# Patient Record
Sex: Female | Born: 1960 | Race: White | Hispanic: No | Marital: Single | Smoking: Never smoker
Health system: Southern US, Community
[De-identification: ages and names within clinical notes are randomized; demographics above are authoritative.]

## PROBLEM LIST (undated history)

## (undated) DIAGNOSIS — K449 Diaphragmatic hernia without obstruction or gangrene: Secondary | ICD-10-CM

## (undated) DIAGNOSIS — Z87442 Personal history of urinary calculi: Secondary | ICD-10-CM

## (undated) DIAGNOSIS — K8020 Calculus of gallbladder without cholecystitis without obstruction: Secondary | ICD-10-CM

## (undated) DIAGNOSIS — M797 Fibromyalgia: Secondary | ICD-10-CM

## (undated) DIAGNOSIS — Z8679 Personal history of other diseases of the circulatory system: Secondary | ICD-10-CM

## (undated) DIAGNOSIS — G40909 Epilepsy, unspecified, not intractable, without status epilepticus: Secondary | ICD-10-CM

## (undated) DIAGNOSIS — G43909 Migraine, unspecified, not intractable, without status migrainosus: Secondary | ICD-10-CM

## (undated) DIAGNOSIS — S99191A Other physeal fracture of right metatarsal, initial encounter for closed fracture: Secondary | ICD-10-CM

## (undated) DIAGNOSIS — I10 Essential (primary) hypertension: Secondary | ICD-10-CM

## (undated) DIAGNOSIS — M542 Cervicalgia: Secondary | ICD-10-CM

## (undated) DIAGNOSIS — J45909 Unspecified asthma, uncomplicated: Secondary | ICD-10-CM

## (undated) DIAGNOSIS — M67439 Ganglion, unspecified wrist: Secondary | ICD-10-CM

## (undated) DIAGNOSIS — N390 Urinary tract infection, site not specified: Secondary | ICD-10-CM

## (undated) DIAGNOSIS — K219 Gastro-esophageal reflux disease without esophagitis: Secondary | ICD-10-CM

## (undated) DIAGNOSIS — E7800 Pure hypercholesterolemia, unspecified: Secondary | ICD-10-CM

## (undated) DIAGNOSIS — M1990 Unspecified osteoarthritis, unspecified site: Secondary | ICD-10-CM

## (undated) DIAGNOSIS — R928 Other abnormal and inconclusive findings on diagnostic imaging of breast: Secondary | ICD-10-CM

## (undated) DIAGNOSIS — G8929 Other chronic pain: Secondary | ICD-10-CM

## (undated) DIAGNOSIS — Z550 Illiteracy and low-level literacy: Secondary | ICD-10-CM

## (undated) DIAGNOSIS — F419 Anxiety disorder, unspecified: Secondary | ICD-10-CM

## (undated) DIAGNOSIS — F32A Depression, unspecified: Secondary | ICD-10-CM

## (undated) DIAGNOSIS — N200 Calculus of kidney: Secondary | ICD-10-CM

## (undated) DIAGNOSIS — F329 Major depressive disorder, single episode, unspecified: Secondary | ICD-10-CM

## (undated) DIAGNOSIS — E119 Type 2 diabetes mellitus without complications: Secondary | ICD-10-CM

## (undated) DIAGNOSIS — I208 Other forms of angina pectoris: Secondary | ICD-10-CM

## (undated) DIAGNOSIS — K760 Fatty (change of) liver, not elsewhere classified: Secondary | ICD-10-CM

## (undated) HISTORY — DX: Other chronic pain: G89.29

## (undated) HISTORY — DX: Fatty (change of) liver, not elsewhere classified: K76.0

## (undated) HISTORY — DX: Major depressive disorder, single episode, unspecified: F32.9

## (undated) HISTORY — DX: Unspecified osteoarthritis, unspecified site: M19.90

## (undated) HISTORY — PX: URETHRA SURGERY: SHX824

## (undated) HISTORY — DX: Pure hypercholesterolemia, unspecified: E78.00

## (undated) HISTORY — DX: Gastro-esophageal reflux disease without esophagitis: K21.9

## (undated) HISTORY — DX: Urinary tract infection, site not specified: N39.0

## (undated) HISTORY — DX: Other forms of angina pectoris: I20.8

## (undated) HISTORY — DX: Depression, unspecified: F32.A

## (undated) HISTORY — DX: Anxiety disorder, unspecified: F41.9

## (undated) HISTORY — DX: Diaphragmatic hernia without obstruction or gangrene: K44.9

## (undated) HISTORY — PX: APPENDECTOMY: SHX54

## (undated) HISTORY — DX: Migraine, unspecified, not intractable, without status migrainosus: G43.909

## (undated) HISTORY — PX: HERNIA REPAIR: SHX51

## (undated) HISTORY — DX: Calculus of kidney: N20.0

## (undated) HISTORY — PX: CHOLECYSTECTOMY: SHX55

## (undated) HISTORY — DX: Cervicalgia: M54.2

## (undated) HISTORY — DX: Fibromyalgia: M79.7

## (undated) HISTORY — DX: Type 2 diabetes mellitus without complications: E11.9

## (undated) HISTORY — DX: Calculus of gallbladder without cholecystitis without obstruction: K80.20

## (undated) HISTORY — DX: Essential (primary) hypertension: I10

## (undated) HISTORY — DX: Epilepsy, unspecified, not intractable, without status epilepticus: G40.909

## (undated) HISTORY — PX: OTHER SURGICAL HISTORY: SHX169

## (undated) HISTORY — DX: Unspecified asthma, uncomplicated: J45.909

---
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
## 1990-02-18 DIAGNOSIS — Z87898 Personal history of other specified conditions: Secondary | ICD-10-CM

## 1990-02-18 HISTORY — DX: Personal history of other specified conditions: Z87.898

## 2012-12-17 ENCOUNTER — Ambulatory Visit: Payer: Self-pay

## 2013-02-18 HISTORY — PX: CHOLECYSTECTOMY: SHX55

## 2013-02-18 HISTORY — PX: COLPOSCOPY: SHX161

## 2014-06-01 HISTORY — PX: COLONOSCOPY: SHX174

## 2015-10-25 HISTORY — PX: ESOPHAGOGASTRODUODENOSCOPY: SHX1529

## 2017-01-01 DIAGNOSIS — M549 Dorsalgia, unspecified: Secondary | ICD-10-CM | POA: Insufficient documentation

## 2017-01-01 DIAGNOSIS — M5442 Lumbago with sciatica, left side: Secondary | ICD-10-CM

## 2017-01-01 DIAGNOSIS — G8929 Other chronic pain: Secondary | ICD-10-CM | POA: Insufficient documentation

## 2017-01-01 DIAGNOSIS — G894 Chronic pain syndrome: Secondary | ICD-10-CM | POA: Insufficient documentation

## 2017-07-28 ENCOUNTER — Ambulatory Visit: Payer: Self-pay | Admitting: Podiatry

## 2017-08-04 ENCOUNTER — Encounter: Payer: Self-pay | Admitting: Podiatry

## 2017-08-04 ENCOUNTER — Ambulatory Visit: Payer: Medicaid Other | Admitting: Podiatry

## 2017-08-04 ENCOUNTER — Ambulatory Visit (INDEPENDENT_AMBULATORY_CARE_PROVIDER_SITE_OTHER): Payer: Medicaid Other

## 2017-08-04 VITALS — BP 108/66 | HR 81 | Temp 98.6°F | Resp 16 | Ht 78.0 in | Wt 181.0 lb

## 2017-08-04 DIAGNOSIS — M216X1 Other acquired deformities of right foot: Secondary | ICD-10-CM | POA: Diagnosis not present

## 2017-08-04 DIAGNOSIS — M79671 Pain in right foot: Secondary | ICD-10-CM

## 2017-08-04 DIAGNOSIS — M7751 Other enthesopathy of right foot: Secondary | ICD-10-CM | POA: Diagnosis not present

## 2017-08-04 DIAGNOSIS — M79672 Pain in left foot: Secondary | ICD-10-CM

## 2017-08-04 DIAGNOSIS — M7752 Other enthesopathy of left foot: Secondary | ICD-10-CM

## 2017-08-04 NOTE — Progress Notes (Signed)
   Subjective:    Patient ID: Hailey Moyer, female    DOB: May 16, 1960, 57 y.o.   MRN: [id number]  HPI    Review of Systems  Musculoskeletal: Positive for arthralgias and myalgias.  All other systems reviewed and are negative.      Objective:   Physical Exam        Assessment & Plan:

## 2017-08-05 ENCOUNTER — Other Ambulatory Visit: Payer: Self-pay | Admitting: Podiatry

## 2017-08-05 DIAGNOSIS — M79672 Pain in left foot: Secondary | ICD-10-CM

## 2017-08-05 DIAGNOSIS — M79671 Pain in right foot: Secondary | ICD-10-CM

## 2017-08-05 DIAGNOSIS — M21619 Bunion of unspecified foot: Secondary | ICD-10-CM

## 2017-08-06 ENCOUNTER — Telehealth: Payer: Self-pay | Admitting: Podiatry

## 2017-08-07 MED ORDER — MELOXICAM 15 MG PO TABS: 15.0000 mg | ORAL_TABLET | Freq: Every day | ORAL | 0 refills | Status: DC

## 2017-08-07 NOTE — Telephone Encounter (Signed)
I spoke with Hailey Moyer and told her the antiinflammatory pain medication had been called to her pharmacy, there had been an error in the escribe, so I had called the rx to her [facility]. Hailey Moyer states she had called to get something for the pain, she can't wear shoes. I told Hailey Moyer the medication was for pain and inflammation, I told her to rest, elevate and ice, Hailey Moyer states her feet cramp when cold and she can't even stand the air condition on them. I told her they were so inflamed, she should just rest and elevate and take the meloxicam.

## 2017-08-07 NOTE — Telephone Encounter (Signed)
Dr. Price please advise 

## 2017-08-07 NOTE — Telephone Encounter (Signed)
I was wondering if I can get some pain medication for my foot pain. My left foot is worse and it hurts to wear a shoe. Please call me back at [phone].

## 2017-08-07 NOTE — Addendum Note (Signed)
Addended by: Ventura Sellers on: 08/07/2017 08:48 AM   Modules accepted: Orders

## 2017-08-07 NOTE — Telephone Encounter (Signed)
Left message at [facility] with orders for meloxicam that did not escribe.

## 2017-08-26 ENCOUNTER — Ambulatory Visit: Payer: Medicaid Other | Admitting: Podiatry

## 2017-09-01 ENCOUNTER — Ambulatory Visit: Payer: Medicaid Other | Admitting: Podiatry

## 2017-09-17 NOTE — Progress Notes (Signed)
  Subjective:  Patient ID: Hailey Moyer, female    DOB: 08-19-1960,  MRN: [id number]  Chief Complaint  Patient presents with  . Foot Pain    B/L dorsal and medial sides of the foot x years; 8/10 sharp constant pain -no injury Tx: advil and aleve Pt. stated," the pain is mostly atll over my feet."     57 y.o. female presents with the above complaint.  Reports pain to the top of both feet. No past medical history on file.  Current Outpatient Medications:  .  meloxicam (MOBIC) 15 MG tablet, Take 1 tablet (15 mg total) by mouth daily., Disp: 30 tablet, Rfl: 0  Not on File Review of Systems: Negative except as noted in the HPI. Denies N/V/F/Ch. Objective:   Vitals:   08/04/17 1336  BP: 108/66  Pulse: 81  Resp: 16  Temp: 98.6 F (37 C)   General AA&O x3. Normal mood and affect.  Vascular Dorsalis pedis and posterior tibial pulses  present 2+ bilaterally  Capillary refill normal to all digits. Pedal hair growth normal.  Neurologic Epicritic sensation grossly present.  Dermatologic No open lesions. Interspaces clear of maceration. Nails well groomed and normal in appearance.  Orthopedic: MMT 5/5 in dorsiflexion, plantarflexion, inversion, and eversion. Normal joint ROM without pain or crepitus. Palpation about the dorsal midfoot bilateral prominent osteophytes   Assessment & Plan:  Patient was evaluated and treated and all questions answered.  Midfoot arthritis bilaterally- -x-rays taken reviewed underlying nerve changes no acute fractures dislocation -Injections delivered to both dorsal TMT's consisting of 0.5 mL Marcaine half percent plain and 0.5 mL dexamethasone to each foot.  Return in about 3 weeks (around 08/25/2017) for midfoot oa bilat.

## 2017-12-25 ENCOUNTER — Telehealth: Payer: Self-pay | Admitting: *Deleted

## 2017-12-25 MED ORDER — MELOXICAM 15 MG PO TABS: 15.0000 mg | ORAL_TABLET | Freq: Every day | ORAL | 0 refills | Status: DC

## 2017-12-25 NOTE — Telephone Encounter (Signed)
Pt walked in requesting a refill of Meloxicam stating that [facility] told her she had to contact us for a refill.  Please advise.  Pt is scheduled for a follow up on Tuesday.

## 2017-12-30 ENCOUNTER — Ambulatory Visit: Payer: Medicaid Other | Admitting: Podiatry

## 2017-12-30 ENCOUNTER — Other Ambulatory Visit: Payer: Self-pay | Admitting: Podiatry

## 2017-12-30 DIAGNOSIS — M797 Fibromyalgia: Secondary | ICD-10-CM

## 2017-12-30 DIAGNOSIS — M7752 Other enthesopathy of left foot: Secondary | ICD-10-CM

## 2017-12-30 DIAGNOSIS — M7751 Other enthesopathy of right foot: Secondary | ICD-10-CM | POA: Diagnosis not present

## 2017-12-30 DIAGNOSIS — M2550 Pain in unspecified joint: Secondary | ICD-10-CM

## 2017-12-30 DIAGNOSIS — M069 Rheumatoid arthritis, unspecified: Secondary | ICD-10-CM

## 2017-12-30 DIAGNOSIS — G8929 Other chronic pain: Secondary | ICD-10-CM

## 2017-12-30 DIAGNOSIS — M79672 Pain in left foot: Secondary | ICD-10-CM

## 2017-12-30 DIAGNOSIS — M79671 Pain in right foot: Secondary | ICD-10-CM

## 2017-12-30 DIAGNOSIS — M19079 Primary osteoarthritis, unspecified ankle and foot: Secondary | ICD-10-CM

## 2017-12-30 NOTE — Progress Notes (Signed)
  Subjective:  Patient ID: Hailey Moyer, female    DOB: [birth year],  MRN: [id number]  Chief Complaint  Patient presents with  . Arthritis    F/U BL midfoot arthritis Pt. states," I've been doing a lot of walking because I'm moving to a new house. The pain has been worst; 7/10 shrap pain." -severe episode of pain Saturday night; 10/10 Tx: meloxicam (not helping much)     57 y.o. female presents with the above complaint. Also complaining of pain in all her joints especially her hands. No sure if she's ever had a workup for rheumatoid arthritis.  Review of Systems: Negative except as noted in the HPI. Denies N/V/F/Ch.  No past medical history on file.  Current Outpatient Medications:  .  meloxicam (MOBIC) 15 MG tablet, Take 1 tablet (15 mg total) by mouth daily., Disp: 30 tablet, Rfl: 0  Social History   Tobacco Use  Smoking Status Never Smoker  Smokeless Tobacco Never Used    Not on File Objective:  There were no vitals filed for this visit. There is no height or weight on file to calculate BMI. Constitutional Well developed. Well nourished.  Vascular Dorsalis pedis pulses palpable bilaterally. Posterior tibial pulses palpable bilaterally. Capillary refill normal to all digits.  No cyanosis or clubbing noted. Pedal hair growth normal.  Neurologic Normal speech. Oriented to person, place, and time. Epicritic sensation to light touch grossly present bilaterally.  Dermatologic Nails well groomed and normal in appearance. No open wounds. No skin lesions.  Orthopedic: Normal joint ROM without pain or crepitus bilaterally. No visible deformities. No bony tenderness.   Radiographs: None today. Assessment:   1. Rheumatoid arthritis, involving unspecified site, unspecified rheumatoid factor presence (HCC)   2. Arthritis of midfoot   3. Chronic pain of multiple joints   4. Pain in both feet   5. Fibromyalgia    Plan:  Patient was evaluated and treated and all questions  answered.  Midfoot Arthritis, bilat -Injection bilat midfoot  Procedure: Joint Injection Location: Bilateral dorsal TMTs joint Skin Prep: Alcohol. Injectate: 0.5 cc 1% lidocaine plain, 0.5 cc dexamethasone phosphate. Disposition: Patient tolerated procedure well. Injection site dressed with a band-aid.  Multijoint Pain, concern for RA -Order arthritis panel. -Will review next visit and refer to Rheumatology if needed.  Return in about 3 weeks (around 01/20/2018) for Bilateral midfoot arthritis. XR next visit both feet

## 2017-12-30 NOTE — Addendum Note (Signed)
Addended by: Dinah Beers on: 12/30/2017 09:36 AM   Modules accepted: Orders

## 2017-12-31 LAB — ANA: ANA Titer 1: NEGATIVE

## 2018-01-02 LAB — CBC WITH DIFFERENTIAL/PLATELET
BASOS ABS: 0.1 10*3/uL (ref 0.0–0.2)
Basos: 2 %
EOS (ABSOLUTE): 0.3 10*3/uL (ref 0.0–0.4)
Eos: 5 %
HEMATOCRIT: 40.3 % (ref 34.0–46.6)
Hemoglobin: 13.3 g/dL (ref 11.1–15.9)
IMMATURE GRANS (ABS): 0 10*3/uL (ref 0.0–0.1)
Immature Granulocytes: 0 %
LYMPHS ABS: 2.4 10*3/uL (ref 0.7–3.1)
Lymphs: 41 %
MCH: 29.7 pg (ref 26.6–33.0)
MCHC: 33 g/dL (ref 31.5–35.7)
MCV: 90 fL (ref 79–97)
Monocytes Absolute: 0.6 10*3/uL (ref 0.1–0.9)
Monocytes: 10 %
NEUTROS ABS: 2.5 10*3/uL (ref 1.4–7.0)
Neutrophils: 42 %
PLATELETS: 369 10*3/uL (ref 150–450)
RBC: 4.48 x10E6/uL (ref 3.77–5.28)
RDW: 12.7 % (ref 12.3–15.4)
WBC: 5.9 10*3/uL (ref 3.4–10.8)

## 2018-01-02 LAB — SEDIMENTATION RATE: SED RATE: 23 mm/h (ref 0–40)

## 2018-01-02 LAB — URIC ACID: Uric Acid: 4.6 mg/dL (ref 2.5–7.1)

## 2018-01-02 LAB — C-REACTIVE PROTEIN: CRP: 1 mg/L (ref 0–10)

## 2018-01-02 LAB — RHEUMATOID FACTOR: Rhuematoid fact SerPl-aCnc: <10 IU/mL (ref 0.0–13.9)

## 2018-01-02 LAB — HLA-B27 ANTIGEN: HLA B27: NEGATIVE

## 2018-01-07 ENCOUNTER — Other Ambulatory Visit: Payer: Self-pay | Admitting: Podiatry

## 2018-01-07 ENCOUNTER — Telehealth: Payer: Self-pay | Admitting: *Deleted

## 2018-01-07 NOTE — Telephone Encounter (Signed)
Patient came in requesting blood work results.  Will call back and ask for Val with her new phone number

## 2018-04-09 ENCOUNTER — Telehealth: Payer: Self-pay | Admitting: Gastroenterology

## 2018-04-09 NOTE — Telephone Encounter (Signed)
Dr.Gupta will you please review patient's records from Dr.Butler's office and advise if you will accept to see patient again.  Thank you, Erie Noe

## 2018-04-12 NOTE — Telephone Encounter (Signed)
OK for OV Notes reviewed and given to Bethann Berkshire Should be available for OV

## 2018-04-13 ENCOUNTER — Encounter: Payer: Self-pay | Admitting: Gastroenterology

## 2018-04-13 NOTE — Telephone Encounter (Signed)
Dr.Gupta reviewed records and accepted to see patient for an office visit. Left message for patient to call back and schedule an appointment.

## 2018-04-13 NOTE — Telephone Encounter (Signed)
Consult scheduled on 3/5 at 10:00am

## 2018-04-16 ENCOUNTER — Encounter: Payer: Self-pay | Admitting: Gastroenterology

## 2018-04-23 ENCOUNTER — Other Ambulatory Visit (INDEPENDENT_AMBULATORY_CARE_PROVIDER_SITE_OTHER): Payer: Medicaid Other

## 2018-04-23 ENCOUNTER — Encounter (INDEPENDENT_AMBULATORY_CARE_PROVIDER_SITE_OTHER): Payer: Self-pay

## 2018-04-23 ENCOUNTER — Encounter: Payer: Self-pay | Admitting: Gastroenterology

## 2018-04-23 ENCOUNTER — Ambulatory Visit: Payer: Medicaid Other | Admitting: Gastroenterology

## 2018-04-23 VITALS — BP 144/92 | HR 65 | Ht 67.0 in | Wt 193.2 lb

## 2018-04-23 DIAGNOSIS — R1011 Right upper quadrant pain: Secondary | ICD-10-CM

## 2018-04-23 DIAGNOSIS — K581 Irritable bowel syndrome with constipation: Secondary | ICD-10-CM

## 2018-04-23 DIAGNOSIS — K219 Gastro-esophageal reflux disease without esophagitis: Secondary | ICD-10-CM

## 2018-04-23 DIAGNOSIS — K449 Diaphragmatic hernia without obstruction or gangrene: Secondary | ICD-10-CM

## 2018-04-23 LAB — COMPREHENSIVE METABOLIC PANEL
ALT: 37 U/L — ABNORMAL HIGH (ref 0–35)
AST: 28 U/L (ref 0–37)
Albumin: 4.4 g/dL (ref 3.5–5.2)
Alkaline Phosphatase: 84 U/L (ref 39–117)
BUN: 13 mg/dL (ref 6–23)
CALCIUM: 9.7 mg/dL (ref 8.4–10.5)
CO2: 27 meq/L (ref 19–32)
Chloride: 105 mEq/L (ref 96–112)
Creatinine, Ser: 0.73 mg/dL (ref 0.40–1.20)
GFR: 81.62 mL/min (ref >60.00)
Glucose, Bld: 89 mg/dL (ref 70–99)
Potassium: 3.9 mEq/L (ref 3.5–5.1)
Sodium: 140 mEq/L (ref 135–145)
Total Bilirubin: 0.6 mg/dL (ref 0.2–1.2)
Total Protein: 7.2 g/dL (ref 6.0–8.3)

## 2018-04-23 LAB — LIPASE: Lipase: 13 U/L (ref 11.0–59.0)

## 2018-04-23 LAB — CBC WITH DIFFERENTIAL/PLATELET
BASOS PCT: 1.3 % (ref 0.0–3.0)
Basophils Absolute: 0.1 10*3/uL (ref 0.0–0.1)
Eosinophils Absolute: 0.2 10*3/uL (ref 0.0–0.7)
Eosinophils Relative: 3 % (ref 0.0–5.0)
HEMATOCRIT: 41.4 % (ref 36.0–46.0)
Hemoglobin: 13.7 g/dL (ref 12.0–15.0)
Lymphocytes Relative: 39.1 % (ref 12.0–46.0)
Lymphs Abs: 2.7 10*3/uL (ref 0.7–4.0)
MCHC: 33.1 g/dL (ref 30.0–36.0)
MCV: 90.9 fl (ref 78.0–100.0)
Monocytes Absolute: 0.6 10*3/uL (ref 0.1–1.0)
Monocytes Relative: 8.8 % (ref 3.0–12.0)
Neutro Abs: 3.3 10*3/uL (ref 1.4–7.7)
Neutrophils Relative %: 47.8 % (ref 43.0–77.0)
PLATELETS: 326 10*3/uL (ref 150.0–400.0)
RBC: 4.56 Mil/uL (ref 3.87–5.11)
RDW: 13.3 % (ref 11.5–15.5)
WBC: 6.9 10*3/uL (ref 4.0–10.5)

## 2018-04-23 NOTE — Patient Instructions (Addendum)
If you are age 60 or older, your body mass index should be between 23-30. Your Body mass index is 30.27 kg/m. If this is out of the aforementioned range listed, please consider follow up with your Primary Care Provider.  If you are age 12 or younger, your body mass index should be between 19-25. Your Body mass index is 30.27 kg/m. If this is out of the aformentioned range listed, please consider follow up with your Primary Care Provider.   We have sent the following medications to your pharmacy for you to pick up at your convenience: Protonix 40mg  by mouth once daily. STOP taking your Nexium  Please go to the lab on the 2nd floor [address] before you leave the office today.   Please call our office at [phone] to set up your 3 month follow up visit.  Thank you,  Dr. Lynann Bologna

## 2018-04-23 NOTE — Progress Notes (Signed)
Chief Complaint: Abdominal pain  Referring Provider:  Pc, [facility]*      ASSESSMENT AND PLAN;   #1. RUQ pain -likely musculoskeletal, could be related to back pain.  Has reproducible muscle tenderness. Neg CT abdo/pel 03/12/2017 except for fatty liver (report sent for scanning), neg [place] 03/23/2018, 12/2016, neg EGD 10/2015, UGI series, HIDA with EF but had symptoms, s/p cholecystectomy without pain relief.  #2. IBS with constipation. Neg colon 05/2014 (rpt in 66yr) #3. GERD with small HH on EGD 10/2015. Neg SB Bx #4. Fatty liver.  Plan: - Change nexium to protonix 40mg  po qd #30, 6 refills. - Check CBC, CMP, lipase. - Heating pads and Biofreeze. - Colace 1 po bid. - Encouraged her to lose weight as the only treatment for fatty liver for now. - If not better in 2 weeks, recommend MRI back.  She will get in touch with [facility]. - RTC 12 weeks.   HPI:    Hailey Moyer is a 58 y.o. female  RUQ pain x 7-8 yrs Extensive GI evaluation-including multiple ultrasounds, CT scans, and EGD which was negative for etiology.  Multiple scans did show fatty liver. History of constipation-somewhat better on Colace twice a day. Denies having any melena or hematochezia  Abdominal pain is mostly in the right upper quadrant and back-exacerbated by walking, sitting for a longer time, exercise.  No nausea, vomiting, odynophagia or dysphagia.  Had heartburn despite of Nexium.  Only occasionally.  Accompanied by her family.  Has been to multiple gastroenterologists.   Past Medical History:  Diagnosis Date  . Angina of effort (HCC)   . Anxiety   . Arthritis   . Asthma   . Chronic neck pain   . Depression   . Elevated cholesterol   . Epilepsy (HCC)   . Fatty liver   . Fibromyalgia   . Gallstone   . GERD (gastroesophageal reflux disease)   . Hiatal hernia   . HTN (hypertension), benign   . Kidney stones   . Migraine   . UTI (urinary tract infection)      Past Surgical History:  Procedure Laterality Date  . APPENDECTOMY    . CHOLECYSTECTOMY    . COLONOSCOPY  06/01/2014   Mild diverticulosis. Otherwise normal colonsocopy.   . Cyst removed from shoulder    . ESOPHAGOGASTRODUODENOSCOPY  10/25/2015   Mild gastritis. Small hiatal hernia. Gastric polyps status post polypectomy x 2.   . HERNIA REPAIR     [facility]   . URETHRA SURGERY      Family History  Problem Relation Age of Onset  . Diabetes Mother   . Heart disease Mother   . Diabetes Sister   . Hypertension Sister   . Kidney cancer Father   . Colon cancer Neg Hx   . Esophageal cancer Neg Hx     Social History   Tobacco Use  . Smoking status: Never Smoker  . Smokeless tobacco: Never Used  Substance Use Topics  . Alcohol use: Not Currently  . Drug use: Never    Current Outpatient Medications  Medication Sig Dispense Refill  . Aspirin-Acetaminophen-Caffeine (EXCEDRIN MIGRAINE PO) Take 1 tablet by mouth as needed.    . cyclobenzaprine (FLEXERIL) 10 MG tablet Take by mouth as needed for muscle spasms.    Marland Kitchen dicyclomine (BENTYL) 20 MG tablet 1 tablet 2 (two) times daily.    Marland Kitchen esomeprazole (NEXIUM) 40 MG packet Take 40 mg by mouth daily.    Marland Kitchen  FLUoxetine (PROZAC) 10 MG capsule 1 capsule daily.    Marland Kitchen lisinopril (PRINIVIL,ZESTRIL) 10 MG tablet 1 tablet daily.    . meloxicam (MOBIC) 15 MG tablet Take 1 tablet (15 mg total) by mouth daily. 30 tablet 0  . Phenylephrine-Acetaminophen (EXCEDRIN SINUS HEADACHE PO) Take 1 tablet by mouth as needed.    . promethazine (PHENERGAN) 25 MG tablet 1 tablet daily.    Marland Kitchen PROAIR HFA 108 (90 Base) MCG/ACT inhaler as needed.     No current facility-administered medications for this visit.     Not on File  Review of Systems:  Constitutional: Denies fever, chills, diaphoresis, appetite change and fatigue.  HEENT: Denies photophobia, eye pain, redness, hearing loss, ear pain, congestion, sore throat, rhinorrhea, sneezing, mouth sores, neck pain,  neck stiffness and tinnitus.   Respiratory: Denies SOB, DOE, cough, chest tightness,  and wheezing.   Cardiovascular: Denies chest pain, palpitations and leg swelling.  Genitourinary: Denies dysuria, urgency, frequency, hematuria, flank pain and difficulty urinating.  Musculoskeletal: Has myalgias, back pain, joint swelling, arthralgias and gait problem.  Skin: No rash.  Neurological: Denies dizziness, seizures, syncope, weakness, light-headedness, numbness and headaches.  Hematological: Denies adenopathy. Easy bruising, personal or family bleeding history  Psychiatric/Behavioral: Has anxiety or depression     Physical Exam:    BP (!) 144/92   Pulse 65   Ht 5\' 7"  (1.702 m)   Wt 193 lb 4 oz (87.7 kg)   BMI 30.27 kg/m  Filed Weights   04/23/18 1002  Weight: 193 lb 4 oz (87.7 kg)   Constitutional:  Well-developed, in no acute distress. Psychiatric: Normal mood and affect. Behavior is normal. HEENT: Pupils normal.  Conjunctivae are normal. No scleral icterus. Neck supple.  Cardiovascular: Normal rate, regular rhythm. No edema Pulmonary/chest: Effort normal and breath sounds normal. No wheezing, rales or rhonchi. Abdominal: Soft, nondistended. Nontender. Bowel sounds active throughout. There are no masses palpable. No hepatomegaly. Rectal:  defered Neurological: Alert and oriented to person place and time. Skin: Skin is warm and dry. No rashes noted.  Data Reviewed: I have personally reviewed following labs and imaging studies  CBC: CBC Latest Ref Rng & Units 12/30/2017  WBC 3.4 - 10.8 x10E3/uL 5.9  Hemoglobin 11.1 - 15.9 g/dL 66.0  Hematocrit 60.0 - 46.6 % 40.3  Platelets 150 - 450 x10E3/uL 369     Edman Circle, MD 04/23/2018, 10:36 AM  Cc: P[facility], [facility]*

## 2018-04-27 ENCOUNTER — Other Ambulatory Visit: Payer: Self-pay

## 2018-04-27 DIAGNOSIS — K219 Gastro-esophageal reflux disease without esophagitis: Secondary | ICD-10-CM

## 2018-04-27 DIAGNOSIS — R1011 Right upper quadrant pain: Secondary | ICD-10-CM

## 2018-04-27 MED ORDER — PANTOPRAZOLE SODIUM 40 MG PO TBEC: 40.0000 mg | DELAYED_RELEASE_TABLET | Freq: Every day | ORAL | 6 refills | Status: DC

## 2018-04-27 NOTE — Progress Notes (Signed)
Patient returned call to the office; patient informed of MD recommendations and result note; patient is agreeable to plan of care; patient did request the PPI that Dr. Chales Abrahams had mentioned in the OV note to be sent in to the pharmacy; RX sent to pharmacy of patient choice; patient was advised to call back if questions/concerns arise; patient verbalized understanding of information/instructions;

## 2018-05-25 ENCOUNTER — Telehealth: Payer: Self-pay | Admitting: Gastroenterology

## 2018-05-25 NOTE — Telephone Encounter (Signed)
The pt states she continues to have abd and back pain.  Per her last office visit note with Dr Chales Abrahams the pt should follow up with [facility].  The pt agrees.

## 2018-05-25 NOTE — Telephone Encounter (Signed)
Pt states that she has been having stomach pain and would like some medication for the pain.

## 2018-07-29 DIAGNOSIS — M722 Plantar fascial fibromatosis: Secondary | ICD-10-CM | POA: Insufficient documentation

## 2018-07-29 DIAGNOSIS — M24572 Contracture, left ankle: Secondary | ICD-10-CM | POA: Insufficient documentation

## 2018-09-09 DIAGNOSIS — M2012 Hallux valgus (acquired), left foot: Secondary | ICD-10-CM | POA: Insufficient documentation

## 2018-11-19 ENCOUNTER — Other Ambulatory Visit: Payer: Self-pay | Admitting: Podiatry

## 2018-11-19 NOTE — Telephone Encounter (Signed)
Dr. Price please advice 

## 2018-12-02 ENCOUNTER — Other Ambulatory Visit: Payer: Self-pay

## 2018-12-02 DIAGNOSIS — K219 Gastro-esophageal reflux disease without esophagitis: Secondary | ICD-10-CM

## 2018-12-02 DIAGNOSIS — R1011 Right upper quadrant pain: Secondary | ICD-10-CM

## 2018-12-02 MED ORDER — PANTOPRAZOLE SODIUM 40 MG PO TBEC: 40.0000 mg | DELAYED_RELEASE_TABLET | Freq: Every day | ORAL | 3 refills | Status: DC

## 2019-02-18 DIAGNOSIS — U071 COVID-19: Secondary | ICD-10-CM

## 2019-02-18 HISTORY — DX: COVID-19: U07.1

## 2019-03-10 ENCOUNTER — Telehealth: Payer: Self-pay | Admitting: Gastroenterology

## 2019-03-11 NOTE — Telephone Encounter (Signed)
Called and spoke with patient-patient informed of MD recommendations and is agreeable to plan of care, however, she will have to "talk with my son to see what day he can drive me to an appointment"; patient advised to call back to the office to schedule an appt;  Patient advised to call back to the office at [phone] should questions/concerns arise;  Patient verbalized understanding of information/instructions;

## 2019-03-11 NOTE — Telephone Encounter (Signed)
Have not seen her for almost a year Needs to be seen or televisit (prefer in person) Pl set her with Jill Side or me (whoever can get her sooner) Thx  RG

## 2019-03-11 NOTE — Telephone Encounter (Signed)
Called and spoke with patient-patient reports she had a bowel movement (2 weeks ago) that had "a worm looking thing in it and it just "worries me";  -PCP advised patient to call us "if it happens again and to collect the stool specimen; -no recent antibiotic use;  -reports abd pain has continued ever since having a cholecystectomy- "my stomach knots up" -PCP has not ordered any testing but she is requesting to have xrays done and then an MRI to "see what is going on"- -denies fever, rectal bleeding, rectal pain  Please advise as patient will need any testing done at [facility] due to transportation issues

## 2019-04-16 ENCOUNTER — Telehealth: Payer: Self-pay | Admitting: Gastroenterology

## 2019-04-16 DIAGNOSIS — R1011 Right upper quadrant pain: Secondary | ICD-10-CM

## 2019-04-16 DIAGNOSIS — K219 Gastro-esophageal reflux disease without esophagitis: Secondary | ICD-10-CM

## 2019-04-16 DIAGNOSIS — K581 Irritable bowel syndrome with constipation: Secondary | ICD-10-CM

## 2019-04-16 NOTE — Telephone Encounter (Signed)
Pls call pt. She would like to speak with you regarding an appt for next week.

## 2019-04-19 NOTE — Telephone Encounter (Signed)
Called and spoke with patient -she reports she was told by her PCP at [facility] that she passed a tape worm and also has a UTI-was given multiple antibiotics however, symptoms have not improved and she is now wanting to be see by Dr. Chales Abrahams because her PCP did not do any tests -has not been able to eat without having "burning in my stomach" afterwards;  - has tried [employer] but wants to know if she needs "a different medication for this sore throat since I had to throw up what look like a yellow gel egg last week";  -denies fever/chills, diarrhea, SHOB, nausea/vomiting -"other than the one day last week",  Patient has been scheduled to see Dr. Chales Abrahams on 04/26/2019 at 2:30 pm per her request;   Please advise

## 2019-04-20 NOTE — Telephone Encounter (Signed)
Attempted to reach patient- unable to leave a VM as "mailbox is not set up yet"; will attempt to reach patient at a later date/time;

## 2019-04-20 NOTE — Telephone Encounter (Signed)
Looks like she did not have any blood tests recently  -Check CBC, CMP, CRP, lipase, TSH and celiac screen -She has previous history of constipation.  However, if she is having any diarrhea, please check stool for GI pathogens, ova parasites, calprotectin.  -She has appointment coming up with me.    RG

## 2019-04-23 NOTE — Telephone Encounter (Signed)
Called and spoke with patient-patient informed of MD recommendations; patient is agreeable with plan of care and reports she will complete requested lab work prior to appt with Dr. Chales Abrahams; Patient verbalized understanding of information/instructions;  Patient was advised to call the office at [phone] if questions/concerns arise; lab orders placed in Epic; patient reports she is not currently having diarrhea-stool orders not placed at this time; patient is scheduled on 05/04/2019 at 1:30 pm;

## 2019-04-26 ENCOUNTER — Ambulatory Visit: Payer: Medicaid Other | Admitting: Gastroenterology

## 2019-05-04 ENCOUNTER — Ambulatory Visit: Payer: Medicaid Other | Admitting: Gastroenterology

## 2019-05-04 ENCOUNTER — Encounter: Payer: Self-pay | Admitting: Gastroenterology

## 2019-05-04 ENCOUNTER — Other Ambulatory Visit: Payer: Self-pay

## 2019-05-04 DIAGNOSIS — R1011 Right upper quadrant pain: Secondary | ICD-10-CM

## 2019-05-04 DIAGNOSIS — K219 Gastro-esophageal reflux disease without esophagitis: Secondary | ICD-10-CM | POA: Diagnosis not present

## 2019-05-04 MED ORDER — PANTOPRAZOLE SODIUM 40 MG PO TBEC: 40.0000 mg | DELAYED_RELEASE_TABLET | Freq: Every day | ORAL | 6 refills | Status: DC

## 2019-05-04 NOTE — Patient Instructions (Signed)
If you are age 60 or older, your body mass index should be between 23-30. Your Body mass index is 30.46 kg/m. If this is out of the aforementioned range listed, please consider follow up with your Primary Care Provider.  If you are age 54 or younger, your body mass index should be between 19-25. Your Body mass index is 30.46 kg/m. If this is out of the aformentioned range listed, please consider follow up with your Primary Care Provider.   We have sent the following medications to your pharmacy for you to pick up at your convenience: Protonix   You have been scheduled for a Barium Esophogram at [facility] (1st floor of the hospital) on 05/07/19 at 11am. Please arrive 15 minutes prior to your appointment for registration. Make certain not to have anything to eat or drink 3 hours prior to your test. If you need to reschedule for any reason, please contact radiology at [phone] to do so. __________________________________________________________________ A barium swallow is an examination that concentrates on views of the esophagus. This tends to be a double contrast exam (barium and two liquids which, when combined, create a gas to distend the wall of the oesophagus) or single contrast (non-ionic iodine based). The study is usually tailored to your symptoms so a good history is essential. Attention is paid during the study to the form, structure and configuration of the esophagus, looking for functional disorders (such as aspiration, dysphagia, achalasia, motility and reflux) EXAMINATION You may be asked to change into a gown, depending on the type of swallow being performed. A radiologist and radiographer will perform the procedure. The radiologist will advise you of the type of contrast selected for your procedure and direct you during the exam. You will be asked to stand, sit or lie in several different positions and to hold a small amount of fluid in your mouth before being asked to  swallow while the imaging is performed .In some instances you may be asked to swallow barium coated marshmallows to assess the motility of a solid food bolus. The exam can be recorded as a digital or video fluoroscopy procedure. POST PROCEDURE It will take 1-2 days for the barium to pass through your system. To facilitate this, it is important, unless otherwise directed, to increase your fluids for the next 24-48hrs and to resume your normal diet.  This test typically takes about 30 minutes to perform. __________________________________________________________________________________  Please go to the lab at [facility] ([address] [place] You will need to go to level "B", you do not need an appointment for this. Hours available are 7:30 am - 4:30 pm.    Follow up in 12 weeks.   Thank you,  Dr. Lynann Bologna

## 2019-05-04 NOTE — Progress Notes (Addendum)
Chief Complaint: Abdominal pain  Referring Provider:  Pc, [facility]*      ASSESSMENT AND PLAN;   #1. RUQ pain -likely musculoskeletal, could be related to back pain.  Has reproducible muscle tenderness (pos Carnett's). Neg CT abdo/pel 03/12/2017 except for fatty liver (report sent for scanning), neg [place] 03/23/2018, 12/2016, neg EGD 10/2015, UGI series, HIDA with EF but had symptoms, s/p cholecystectomy without pain relief. #2. IBS with constipation. Neg colon 05/2014 (rpt in 61yr #3. GERD with small HH on EGD 10/2015. Neg SB Bx #4. Fatty liver.  Plan: - Continue protonix 435mpo qd #30, 6 refills. - Ba swallow with Ba tab (RE: dyspjagia) - Check CBC, CMP, CRP, lipase, TSH and celiac screen - check stool for GI pathogens, ova and parasites, calprotectin.  - Stop colace - Encouraged her to lose weight as the only treatment for fatty liver for now. - records [facility] - If still with problems, rpt EGD/colon - RTC 12 weeks.  Addendum: [facility] contrast [facility]/15/2019: Fatty liver, SP cholecystectomy, moderate DJD both hips, small right renal calculus, aortic atherosclerosis.  Report sent for scanning. CBC 03/15/2019 with hemoglobin 13.8, MCV 91, platelets 334K, CMP with AST 43, ALT 44, normal alk phos 77, albumin 4.4. HPI:    GlElka Satterfields a [age].o. female   " Had a tapeworm-like structure in the stool" " Also coughed up mucus" " Having problems with food getting hung up lately in the chest"  -Has previous history of constipation.  Lately has been having mushy bowel movements.  Feels like she had " tapeworm like structure" in the stool.  She drew me a picture. -Also had cough and coughed up " large egg like mucus" 2 to 3 weeks ago.  She did draw a picture.  Ever since she has been having problems with swallowing.  Very much concerned regarding the same.  -Has chronic RUQ pain x 7-8 yrs, which she  has noticed gets better with heating pads. Extensive GI evaluation-including multiple ultrasounds, CT scans, and EGD which was negative for etiology.  Multiple scans did show fatty liver.  Denies having any melena or hematochezia  Abdominal pain is mostly in the right upper quadrant and back-exacerbated by walking, sitting for a longer time, exercise.  No nausea, vomiting, odynophagia or dysphagia.  Had heartburn despite of Nexium.  Only occasionally.  Accompanied by her family.  Has been to multiple gastroenterologists.  Had Covid 19 01/2019 Past Medical History:  Diagnosis Date  . Angina of effort ([place]  . Anxiety   . Arthritis   . Asthma   . Chronic neck pain   . Depression   . Elevated cholesterol   . Epilepsy ([place]  . Fatty liver   . Fibromyalgia   . Gallstone   . GERD (gastroesophageal reflux disease)   . Hiatal hernia   . HTN (hypertension), benign   . Kidney stones   . Migraine   . UTI (urinary tract infection)     Past Surgical History:  Procedure Laterality Date  . APPENDECTOMY    . CHOLECYSTECTOMY    . COLONOSCOPY  06/01/2014   Mild diverticulosis. Otherwise normal colonsocopy.   . Cyst removed from shoulder    . ESOPHAGOGASTRODUODENOSCOPY  10/25/2015   Mild gastritis. Small hiatal hernia. Gastric polyps status post polypectomy x 2.   . HERNIA REPAIR  [employer]   . URETHRA SURGERY      Family History  Problem Relation Age of Onset  . Diabetes Mother   . Heart disease Mother   . Diabetes Sister   . Hypertension Sister   . Kidney cancer Father   . Colon cancer Neg Hx   . Esophageal cancer Neg Hx     Social History   Tobacco Use  . Smoking status: Never Smoker  . Smokeless tobacco: Never Used  Substance Use Topics  . Alcohol use: Not Currently  . Drug use: Never    Current Outpatient Medications  Medication Sig Dispense Refill  . Aspirin-Acetaminophen-Caffeine (EXCEDRIN MIGRAINE PO) Take 1 tablet by mouth as needed.    .  cyclobenzaprine (FLEXERIL) 10 MG tablet Take by mouth as needed for muscle spasms.    Marland Kitchen dicyclomine (BENTYL) 20 MG tablet 1 tablet 2 (two) times daily.    Marland Kitchen lisinopril (PRINIVIL,ZESTRIL) 10 MG tablet 1 tablet daily.    . meloxicam (MOBIC) 15 MG tablet TAKE 1 TABLET BY MOUTH DAILY (Patient taking differently: Take 15 mg by mouth as needed. ) 90 tablet 1  . pantoprazole (PROTONIX) 40 MG tablet Take 1 tablet (40 mg total) by mouth daily. 90 tablet 3  . Phenylephrine-Acetaminophen (EXCEDRIN SINUS HEADACHE PO) Take 1 tablet by mouth as needed.    Marland Kitchen PROAIR HFA 108 (90 Base) MCG/ACT inhaler as needed.    . promethazine (PHENERGAN) 25 MG tablet 1 tablet as needed.      No current facility-administered medications for this visit.    Allergies  Allergen Reactions  . Sulfa Antibiotics     Review of Systems:  Has anxiety or depression     Physical Exam:    BP 130/82   Pulse 76   Temp 98.4 F (36.9 C)   Ht _0  (1.702 m)   Wt 194 lb 8 oz (88.2 kg)   BMI 30.46 kg/m  Filed Weights   05/04/19 1316  Weight: 194 lb 8 oz (88.2 kg)   Constitutional:  Well-developed, in no acute distress. Psychiatric: Normal mood and affect. Behavior is normal. HEENT: Pupils normal.  Conjunctivae are normal. No scleral icterus. Neck supple.  Cardiovascular: Normal rate, regular rhythm. No edema Pulmonary/chest: Effort normal and breath sounds normal. No wheezing, rales or rhonchi. Abdominal: Soft, nondistended.  Right upper quad abdominal tenderness-reproducible.  Bowel sounds active throughout. There are no masses palpable. No hepatomegaly. Rectal:  defered Neurological: Alert and oriented to person place and time. Skin: Skin is warm and dry. No rashes noted.  Data Reviewed: I have personally reviewed following labs and imaging studies  CBC: CBC Latest Ref Rng & Units 04/23/2018 12/30/2017  WBC 4.0 - 10.5 K/uL 6.9 5.9  Hemoglobin 12.0 - 15.0 g/dL 13.7 13.3  Hematocrit 36.0 - 46.0 % 41.4 40.3   Platelets 150.0 - 400.0 K/uL 326.0 [place], MD 05/04/2019, 1:46 PM  Cc: Pc, [facility]*

## 2019-05-07 ENCOUNTER — Ambulatory Visit (HOSPITAL_COMMUNITY): Payer: Medicaid Other

## 2019-05-18 ENCOUNTER — Telehealth: Payer: Self-pay | Admitting: Internal Medicine

## 2019-05-18 DIAGNOSIS — R1011 Right upper quadrant pain: Secondary | ICD-10-CM

## 2019-05-18 NOTE — Telephone Encounter (Signed)
Patient called tonight asking for refill of dicyclomine. Recently seen for MS type pain. Advised to call during regular hours to have this request addressed. Will forward to primary GI and nurse.

## 2019-05-19 MED ORDER — DICYCLOMINE HCL 20 MG PO TABS: 20.0000 mg | ORAL_TABLET | Freq: Two times a day (BID) | ORAL | 6 refills | Status: DC | PRN

## 2019-05-19 NOTE — Telephone Encounter (Signed)
Called and spoke with patient-patient informed of MD recommendations; patient is agreeable with plan of care and verified pharmacy, RX sent; patient reports "When I can get my car fixed I will come and do the lab work Dr. Chales Abrahams is wanting me do get done"; patient given address for lab work to be completed at [facility] office; patient verbalized understanding of information/instructions;  Patient was advised to call the office at [phone] if questions/concerns arise;

## 2019-05-19 NOTE — Telephone Encounter (Signed)
Can have Bentyl 20 mg p.o. twice daily as needed, 60, with 6 refills RG

## 2019-05-19 NOTE — Addendum Note (Signed)
Addended by: Johnney Killian on: 05/19/2019 04:37 PM   Modules accepted: Orders

## 2019-05-19 NOTE — Telephone Encounter (Signed)
Please advise if Bentyl refill appropriate

## 2019-05-24 ENCOUNTER — Telehealth: Payer: Self-pay | Admitting: Gastroenterology

## 2019-05-25 NOTE — Telephone Encounter (Signed)
I have tried to call patient and was unable to reach her or leave message.

## 2019-05-26 NOTE — Telephone Encounter (Signed)
I have attempted to call patient again, unable to reach her or leave a message.

## 2019-09-23 ENCOUNTER — Encounter: Payer: Self-pay | Admitting: Neurology

## 2019-10-07 ENCOUNTER — Ambulatory Visit: Payer: Medicaid Other | Admitting: Podiatry

## 2019-10-07 ENCOUNTER — Other Ambulatory Visit: Payer: Self-pay | Admitting: Podiatry

## 2019-10-07 ENCOUNTER — Other Ambulatory Visit: Payer: Self-pay

## 2019-10-07 ENCOUNTER — Ambulatory Visit (INDEPENDENT_AMBULATORY_CARE_PROVIDER_SITE_OTHER): Payer: Medicaid Other

## 2019-10-07 DIAGNOSIS — E559 Vitamin D deficiency, unspecified: Secondary | ICD-10-CM | POA: Diagnosis not present

## 2019-10-07 DIAGNOSIS — M79671 Pain in right foot: Secondary | ICD-10-CM

## 2019-10-07 DIAGNOSIS — S92351A Displaced fracture of fifth metatarsal bone, right foot, initial encounter for closed fracture: Secondary | ICD-10-CM

## 2019-10-07 DIAGNOSIS — G894 Chronic pain syndrome: Secondary | ICD-10-CM

## 2019-10-07 DIAGNOSIS — R600 Localized edema: Secondary | ICD-10-CM

## 2019-10-07 NOTE — Progress Notes (Signed)
Faxed DME order form to [facility] at [phone] for knee scooter. Faxed with pt's demographics, order form and clinicals

## 2019-10-07 NOTE — Progress Notes (Signed)
  Subjective:  Patient ID: Hailey Moyer, female    DOB: 1960/12/05,  MRN: [id number]  Chief Complaint  Patient presents with  . Foot Injury    Rt foot lateral and midfoot pain x injury 09/28/19 (stepped down into a hole and heard 3 pops) - with tingling at toes -went to ER had xrs was dz with a fx and was applied a splint   59 y.o. female presents with the above complaint. History confirmed with patient.   Objective:  Physical Exam: warm, good capillary refill, no trophic changes or ulcerative lesions, normal DP and PT pulses and normal sensory exam. Right Foot: POP R 5th metatarsal without bruising or contusion.  No images are attached to the encounter.  Radiographs: X-ray of the right foot: R 5th metatarsal fracture with interval displacement Assessment:   1. Vitamin D deficiency   2. Closed fracture of base of fifth metatarsal bone of right foot, initial encounter   3. Chronic pain syndrome   4. Localized edema    Plan:  Patient was evaluated and treated and all questions answered.  Fracture Right 5th Metatarsal -XR Reviewed with patient,  -Would benefit from operative management for this injury., -Patient has failed all conservative therapy and wishes to proceed with surgical intervention. All risks, benefits, and alternatives discussed with patient. No guarantees given. Consent reviewed and signed by patient. -Planned procedures: ORIF right 5th metatarsal fracture -Soft cast applied -NWB with knee scooter. Scooter ordered. -CAM boot Rx written for patient -Check Vit D level  Procedure: Soft Cast Application with Unna Boot Rationale: above injury Technique: Unna boot, cast padding, Coban compression dressing applied Disposition: Patient tolerated procedure well. NV status intact post-application.   No follow-ups on file.

## 2019-10-08 LAB — VITAMIN D 25 HYDROXY (VIT D DEFICIENCY, FRACTURES): Vit D, 25-Hydroxy: 19.2 ng/mL — ABNORMAL LOW (ref 30.0–100.0)

## 2019-10-08 MED ORDER — VITAMIN D (ERGOCALCIFEROL) 1.25 MG (50000 UNIT) PO CAPS: 50000.0000 [IU] | ORAL_CAPSULE | ORAL | 1 refills | Status: DC

## 2019-10-08 NOTE — Addendum Note (Signed)
Addended by: Ventura Sellers on: 10/08/2019 08:08 AM   Modules accepted: Orders

## 2019-10-11 ENCOUNTER — Ambulatory Visit: Payer: Medicaid Other | Admitting: Gastroenterology

## 2019-10-11 NOTE — Progress Notes (Signed)
Pt called stating [facility] has no received Rx for knee scooter. I Re-Faxed DME order form to [facility] at [phone] for knee scooter. Faxed with pt's demographics, order form and clinicals

## 2019-10-14 ENCOUNTER — Other Ambulatory Visit: Payer: Self-pay

## 2019-10-14 ENCOUNTER — Telehealth: Payer: Self-pay | Admitting: Podiatry

## 2019-10-14 ENCOUNTER — Encounter (HOSPITAL_BASED_OUTPATIENT_CLINIC_OR_DEPARTMENT_OTHER): Payer: Self-pay | Admitting: Podiatry

## 2019-10-14 ENCOUNTER — Ambulatory Visit: Payer: Self-pay | Admitting: Podiatry

## 2019-10-14 DIAGNOSIS — S92351A Displaced fracture of fifth metatarsal bone, right foot, initial encounter for closed fracture: Secondary | ICD-10-CM

## 2019-10-14 NOTE — Telephone Encounter (Signed)
Called pt to let her know I'm faxing the H&P forms to [facility] for her to pick up and take to her PCP. Stated her PCP will need to fill out and attach any most recent OV notes and fax back to me in [place]. Also stated she would be getting a call about going for her COVID test prior to her sx on Wednesday. Pt stated she is fully vaccinated and I told her the hospital requires the COVID test regardless of vaccination status. Pt stated she would have her sister go Monday or Tuesday to get the forms and take to her PCP. I told her we need everything so we can fax it by 5 pm on Tuesday to the hospital for her H&P. Pt stated she would see if one of her sons can take her today to [place] to pick up the H&P forms and take to her PCP. Told her to call me with any questions.

## 2019-10-14 NOTE — Progress Notes (Addendum)
PATIENT CANNOT READ OR WRITE SIGNS OWN CONSENTS, SISTER OR SON HELPS PATIENT WITH CONSENTS.  WENT OVER ALL PREOP INSTRUCTIONS WITH SISTER DELORES WRIGHT PER PATINT REQUEST OVER PHONE   Spoke w/ via phone for pre-op interview---pt Lab needs dos----  I STAT 8, EKG             Lab results------ COVID test ------ Arrive at ------- NPO after MN NO Solid Food.  Clear liquids from MN until--- Medications to take morning of surgery ----- Diabetic medication ----- Patient Special Instructions ----- Pre-Op special Istructions ----- Patient verbalized understanding of instructions that were given at this phone interview. Patient denies shortness of breath, chest pain, fever, cough at this phone interview.  Anesthesia Review:HX OF ANGINA 5 OR 6 YRS AGO, SAW CARDIOLOGIST AND RELEASED, NO ANGINA SINCE, HX HTN, HX OF SEIZURE X 1 MANY  YRS AGO WITH PREGNANCY NONE SINCE, HAS ASTHMA RARE INHALER USE. PATIENT DENIES ANY CARDIAC S & S OR SOB AT PRE OP PHONE CALL  PCP:DR TARA GUNTER [facility] Cardiologist :NONE Chest x-ray :NONE EKG :NONE Echo :NONE Stress test:NONE Cardiac Cath : NONE Activity level: WALKS IN HOUSE, LIMITED MOBILITY DUE TO RIGHT METATARSAL FRACTURE WEARS BOOT ON RIGHT FOOT Sleep Study/ CPAP :N/A Fasting Blood Sugar :      / Checks Blood Sugar -- times a day:  N/A Blood Thinner/ Instructions /Last Dose:N/A ASA / Instructions/ Last Dose : N/A

## 2019-10-18 ENCOUNTER — Other Ambulatory Visit: Payer: Self-pay | Admitting: Podiatry

## 2019-10-18 ENCOUNTER — Other Ambulatory Visit (HOSPITAL_COMMUNITY)
Admission: RE | Admit: 2019-10-18 | Discharge: 2019-10-18 | Disposition: A | Discharge status: Home / Self Care | Payer: Medicaid Other | Source: Ambulatory Visit | Attending: Podiatry | Admitting: Podiatry

## 2019-10-18 DIAGNOSIS — Z01812 Encounter for preprocedural laboratory examination: Secondary | ICD-10-CM | POA: Insufficient documentation

## 2019-10-18 DIAGNOSIS — S92351A Displaced fracture of fifth metatarsal bone, right foot, initial encounter for closed fracture: Secondary | ICD-10-CM

## 2019-10-18 DIAGNOSIS — Z20822 Contact with and (suspected) exposure to covid-19: Secondary | ICD-10-CM | POA: Insufficient documentation

## 2019-10-18 LAB — SARS CORONAVIRUS 2 (TAT 6-24 HRS): SARS Coronavirus 2: NEGATIVE

## 2019-10-19 NOTE — Anesthesia Preprocedure Evaluation (Addendum)
Anesthesia Evaluation  Patient identified by MRN, date of birth, ID band Patient awake    Reviewed: Allergy & Precautions, NPO status , Patient's Chart, lab work & pertinent test results  Airway Mallampati: II  TM Distance: >3 FB Neck ROM: Full    Dental no notable dental hx. (+) Teeth Intact, Dental Advisory Given   Pulmonary asthma ,    Pulmonary exam normal breath sounds clear to auscultation       Cardiovascular hypertension, Pt. on medications Normal cardiovascular exam Rhythm:Regular Rate:Normal     Neuro/Psych  Headaches, PSYCHIATRIC DISORDERS Anxiety  Neuromuscular disease    GI/Hepatic Neg liver ROS, hiatal hernia, GERD  Medicated and Controlled,  Endo/Other  negative endocrine ROS  Renal/GU negative Renal ROS     Musculoskeletal  (+) Arthritis ,   Abdominal   Peds  Hematology   Anesthesia Other Findings   Reproductive/Obstetrics                            Anesthesia Physical Anesthesia Plan  ASA: II  Anesthesia Plan: General   Post-op Pain Management:  Regional for Post-op pain   Induction: Intravenous  PONV Risk Score and Plan: 4 or greater and Treatment may vary due to age or medical condition, Ondansetron, Dexamethasone and Midazolam  Airway Management Planned: LMA  Additional Equipment: None  Intra-op Plan:   Post-operative Plan:   Informed Consent:     Dental advisory given  Plan Discussed with: CRNA and Anesthesiologist  Anesthesia Plan Comments: (LMA w R Popliteal block)       Anesthesia Quick Evaluation

## 2019-10-19 NOTE — Progress Notes (Signed)
COVID Vaccine Completed: Date COVID Vaccine completed: COVID vaccine manufacturer: Pfizer    Moderna   [employer]'s   PCP - [facility] Cardiologist -   Chest x-ray -  EKG -  Stress Test -  ECHO -  Cardiac Cath -   Sleep Study -  CPAP -   Fasting Blood Sugar -  Checks Blood Sugar _____ times a day  Blood Thinner Instructions: Aspirin Instructions: Last Dose:  Anesthesia review:   Patient denies shortness of breath, fever, cough and chest pain at PAT appointment   Patient verbalized understanding of instructions that were given to them at the PAT appointment. Patient was also instructed that they will need to review over the PAT instructions again at home before surgery.

## 2019-10-20 ENCOUNTER — Telehealth (INDEPENDENT_AMBULATORY_CARE_PROVIDER_SITE_OTHER): Payer: Medicaid Other | Admitting: Podiatry

## 2019-10-20 ENCOUNTER — Ambulatory Visit (HOSPITAL_COMMUNITY)
Admission: RE | Admit: 2019-10-20 | Discharge: 2019-10-20 | Disposition: A | Discharge status: Home / Self Care | Payer: Medicaid Other | Attending: Podiatry | Admitting: Podiatry

## 2019-10-20 ENCOUNTER — Other Ambulatory Visit: Payer: Self-pay

## 2019-10-20 ENCOUNTER — Encounter (HOSPITAL_COMMUNITY): Payer: Self-pay | Admitting: Podiatry

## 2019-10-20 ENCOUNTER — Ambulatory Visit (HOSPITAL_COMMUNITY): Payer: Medicaid Other | Admitting: Physician Assistant

## 2019-10-20 ENCOUNTER — Ambulatory Visit (HOSPITAL_COMMUNITY): Payer: Medicaid Other

## 2019-10-20 ENCOUNTER — Encounter (HOSPITAL_COMMUNITY)
Admission: RE | Disposition: A | Discharge status: Home / Self Care | Payer: Self-pay | Source: Home / Self Care | Attending: Podiatry

## 2019-10-20 DIAGNOSIS — K589 Irritable bowel syndrome without diarrhea: Secondary | ICD-10-CM | POA: Diagnosis not present

## 2019-10-20 DIAGNOSIS — I10 Essential (primary) hypertension: Secondary | ICD-10-CM | POA: Insufficient documentation

## 2019-10-20 DIAGNOSIS — F329 Major depressive disorder, single episode, unspecified: Secondary | ICD-10-CM | POA: Diagnosis not present

## 2019-10-20 DIAGNOSIS — X58XXXA Exposure to other specified factors, initial encounter: Secondary | ICD-10-CM | POA: Insufficient documentation

## 2019-10-20 DIAGNOSIS — S92351A Displaced fracture of fifth metatarsal bone, right foot, initial encounter for closed fracture: Secondary | ICD-10-CM | POA: Diagnosis present

## 2019-10-20 DIAGNOSIS — Z419 Encounter for procedure for purposes other than remedying health state, unspecified: Secondary | ICD-10-CM

## 2019-10-20 DIAGNOSIS — S92353A Displaced fracture of fifth metatarsal bone, unspecified foot, initial encounter for closed fracture: Secondary | ICD-10-CM

## 2019-10-20 HISTORY — PX: ORIF TOE FRACTURE: SHX5032

## 2019-10-20 HISTORY — DX: Personal history of other diseases of the circulatory system: Z86.79

## 2019-10-20 HISTORY — DX: Personal history of urinary calculi: Z87.442

## 2019-10-20 HISTORY — DX: Ganglion, unspecified wrist: M67.439

## 2019-10-20 HISTORY — DX: Other physeal fracture of right metatarsal, initial encounter for closed fracture: S99.191A

## 2019-10-20 HISTORY — DX: Illiteracy and low-level literacy: Z55.0

## 2019-10-20 HISTORY — DX: Other abnormal and inconclusive findings on diagnostic imaging of breast: R92.8

## 2019-10-20 LAB — BASIC METABOLIC PANEL
Anion gap: 13 (ref 5–15)
BUN: 13 mg/dL (ref 6–20)
CO2: 23 mmol/L (ref 22–32)
Calcium: 9.5 mg/dL (ref 8.9–10.3)
Chloride: 102 mmol/L (ref 98–111)
Creatinine, Ser: 0.82 mg/dL (ref 0.44–1.00)
GFR calc Af Amer: >60 mL/min (ref >60)
GFR calc non Af Amer: >60 mL/min (ref >60)
Glucose, Bld: 113 mg/dL — ABNORMAL HIGH (ref 70–99)
Potassium: 3.6 mmol/L (ref 3.5–5.1)
Sodium: 138 mmol/L (ref 135–145)

## 2019-10-20 LAB — CBC
HCT: 42.2 % (ref 36.0–46.0)
Hemoglobin: 13.8 g/dL (ref 12.0–15.0)
MCH: 30.3 pg (ref 26.0–34.0)
MCHC: 32.7 g/dL (ref 30.0–36.0)
MCV: 92.5 fL (ref 80.0–100.0)
Platelets: 335 10*3/uL (ref 150–400)
RBC: 4.56 MIL/uL (ref 3.87–5.11)
RDW: 13 % (ref 11.5–15.5)
WBC: 8.3 10*3/uL (ref 4.0–10.5)
nRBC: 0 % (ref 0.0–0.2)

## 2019-10-20 SURGERY — OPEN REDUCTION INTERNAL FIXATION (ORIF) METATARSAL (TOE) FRACTURE
Anesthesia: General | Site: Toe | Laterality: Right

## 2019-10-20 MED ORDER — LIDOCAINE 2% (20 MG/ML) 5 ML SYRINGE
INTRAMUSCULAR | Status: AC
  Filled 2019-10-20: qty 5

## 2019-10-20 MED ORDER — CEPHALEXIN 500 MG PO CAPS: 500.0000 mg | ORAL_CAPSULE | Freq: Four times a day (QID) | ORAL | 0 refills | Status: AC

## 2019-10-20 MED ORDER — LACTATED RINGERS IV SOLN: INTRAVENOUS | Status: DC

## 2019-10-20 MED ORDER — ONDANSETRON HCL 4 MG/2ML IJ SOLN: 4.0000 mg | Freq: Once | INTRAMUSCULAR | Status: DC | PRN

## 2019-10-20 MED ORDER — FENTANYL CITRATE (PF) 100 MCG/2ML IJ SOLN: 25.0000 ug | INTRAMUSCULAR | Status: DC | PRN

## 2019-10-20 MED ORDER — ONDANSETRON HCL 4 MG/2ML IJ SOLN
INTRAMUSCULAR | Status: AC
  Filled 2019-10-20: qty 2

## 2019-10-20 MED ORDER — EPHEDRINE 5 MG/ML INJ
INTRAVENOUS | Status: AC
  Filled 2019-10-20: qty 10

## 2019-10-20 MED ORDER — CEFAZOLIN SODIUM-DEXTROSE 2-4 GM/100ML-% IV SOLN
INTRAVENOUS | Status: AC
  Filled 2019-10-20: qty 100

## 2019-10-20 MED ORDER — ONDANSETRON HCL 4 MG PO TABS: 4.0000 mg | ORAL_TABLET | Freq: Three times a day (TID) | ORAL | 0 refills | Status: AC | PRN

## 2019-10-20 MED ORDER — EPHEDRINE SULFATE-NACL 50-0.9 MG/10ML-% IV SOSY
PREFILLED_SYRINGE | INTRAVENOUS | Status: DC | PRN
  Administered 2019-10-20: 10 mg via INTRAVENOUS

## 2019-10-20 MED ORDER — LIDOCAINE 2% (20 MG/ML) 5 ML SYRINGE
INTRAMUSCULAR | Status: DC | PRN
  Administered 2019-10-20: 100 mg via INTRAVENOUS

## 2019-10-20 MED ORDER — MIDAZOLAM HCL 2 MG/2ML IJ SOLN
INTRAMUSCULAR | Status: AC
  Filled 2019-10-20: qty 2

## 2019-10-20 MED ORDER — MIDAZOLAM HCL 2 MG/2ML IJ SOLN
1.0000 mg | INTRAMUSCULAR | Status: DC
  Administered 2019-10-20: 2 mg via INTRAVENOUS
  Filled 2019-10-20: qty 2

## 2019-10-20 MED ORDER — FENTANYL CITRATE (PF) 100 MCG/2ML IJ SOLN
INTRAMUSCULAR | Status: DC | PRN
Start: 2019-10-20 — End: 2019-10-20
  Administered 2019-10-20: 25 ug via INTRAVENOUS

## 2019-10-20 MED ORDER — BUPIVACAINE HCL (PF) 0.5 % IJ SOLN
INTRAMUSCULAR | Status: AC
  Filled 2019-10-20: qty 30

## 2019-10-20 MED ORDER — PROPOFOL 10 MG/ML IV BOLUS
INTRAVENOUS | Status: AC
  Filled 2019-10-20: qty 20

## 2019-10-20 MED ORDER — ONDANSETRON HCL 4 MG/2ML IJ SOLN
INTRAMUSCULAR | Status: DC | PRN
  Administered 2019-10-20: 4 mg via INTRAVENOUS

## 2019-10-20 MED ORDER — PROPOFOL 10 MG/ML IV BOLUS
INTRAVENOUS | Status: DC | PRN
  Administered 2019-10-20: 150 mg via INTRAVENOUS

## 2019-10-20 MED ORDER — CEFAZOLIN SODIUM-DEXTROSE 2-4 GM/100ML-% IV SOLN
2.0000 g | INTRAVENOUS | Status: AC
  Administered 2019-10-20: 2 g via INTRAVENOUS

## 2019-10-20 MED ORDER — FENTANYL CITRATE (PF) 100 MCG/2ML IJ SOLN
INTRAMUSCULAR | Status: AC
  Filled 2019-10-20: qty 2

## 2019-10-20 MED ORDER — ROPIVACAINE HCL 5 MG/ML IJ SOLN
INTRAMUSCULAR | Status: DC | PRN
  Administered 2019-10-20: 30 mL via PERINEURAL

## 2019-10-20 MED ORDER — ACETAMINOPHEN 10 MG/ML IV SOLN: 1000.0000 mg | Freq: Once | INTRAVENOUS | Status: DC | PRN

## 2019-10-20 MED ORDER — CLONIDINE HCL (ANALGESIA) 100 MCG/ML EP SOLN
EPIDURAL | Status: DC | PRN
  Administered 2019-10-20: 100 ug

## 2019-10-20 MED ORDER — ORAL CARE MOUTH RINSE: 15.0000 mL | Freq: Once | OROMUCOSAL | Status: AC

## 2019-10-20 MED ORDER — CHLORHEXIDINE GLUCONATE 0.12 % MT SOLN
15.0000 mL | Freq: Once | OROMUCOSAL | Status: AC
  Administered 2019-10-20: 15 mL via OROMUCOSAL

## 2019-10-20 MED ORDER — FENTANYL CITRATE (PF) 100 MCG/2ML IJ SOLN
50.0000 ug | INTRAMUSCULAR | Status: DC
  Administered 2019-10-20 (×2): 50 ug via INTRAVENOUS
  Filled 2019-10-20: qty 2

## 2019-10-20 MED ORDER — DEXAMETHASONE SODIUM PHOSPHATE 10 MG/ML IJ SOLN
INTRAMUSCULAR | Status: DC | PRN
  Administered 2019-10-20: 10 mg via INTRAVENOUS

## 2019-10-20 MED ORDER — OXYCODONE-ACETAMINOPHEN 5-325 MG PO TABS: 1.0000 | ORAL_TABLET | ORAL | 0 refills | Status: DC | PRN

## 2019-10-20 MED ORDER — DEXAMETHASONE SODIUM PHOSPHATE 10 MG/ML IJ SOLN
INTRAMUSCULAR | Status: AC
  Filled 2019-10-20: qty 1

## 2019-10-20 SURGICAL SUPPLY — 64 items
APL PRP STRL LF DISP 70% ISPRP (MISCELLANEOUS) ×1
BANDAGE ESMARK 6X9 LF (GAUZE/BANDAGES/DRESSINGS) ×1 IMPLANT
BIT DRILL CAN 3.5MM (BIT) ×1
BIT DRILL CANN 3.5 (BIT) ×2
BIT DRILL SRG 3.5XCAN FFTH MTR (BIT) IMPLANT
BIT DRL SRG 3.5XCANN FIFTH MTR (BIT) ×1
BLADE AVERAGE 25MMX9MM (BLADE)
BLADE AVERAGE 25X9 (BLADE) IMPLANT
BLADE MINI RND TIP GREEN BEAV (BLADE) ×3 IMPLANT
BLADE OSC/SAG .038X5.5 CUT EDG (BLADE) IMPLANT
BLADE SURG 15 STRL LF DISP TIS (BLADE) ×1 IMPLANT
BLADE SURG 15 STRL SS (BLADE) ×3
BNDG CMPR 9X6 STRL LF SNTH (GAUZE/BANDAGES/DRESSINGS) ×1
BNDG ELASTIC 4X5.8 VLCR STR LF (GAUZE/BANDAGES/DRESSINGS) ×3 IMPLANT
BNDG ESMARK 6X9 LF (GAUZE/BANDAGES/DRESSINGS) ×3
BNDG GAUZE ELAST 4 BULKY (GAUZE/BANDAGES/DRESSINGS) ×3 IMPLANT
CHLORAPREP W/TINT 26 (MISCELLANEOUS) ×3 IMPLANT
COVER BACK TABLE 60X90IN (DRAPES) ×3 IMPLANT
COVER WAND RF STERILE (DRAPES) ×3 IMPLANT
CUFF TOURN SGL QUICK 18X4 (TOURNIQUET CUFF) ×2 IMPLANT
CUFF TOURN SGL QUICK 24 (TOURNIQUET CUFF)
CUFF TOURN SGL QUICK 34 (TOURNIQUET CUFF)
CUFF TRNQT CYL 24X4X16.5-23 (TOURNIQUET CUFF) IMPLANT
CUFF TRNQT CYL 34X4.125X (TOURNIQUET CUFF) IMPLANT
DECANTER SPIKE VIAL GLASS SM (MISCELLANEOUS) IMPLANT
DRAPE 3/4 80X56 (DRAPES) ×3 IMPLANT
DRAPE EXTREMITY T 121X128X90 (DISPOSABLE) ×3 IMPLANT
DRAPE OEC MINIVIEW 54X84 (DRAPES) ×3 IMPLANT
DRAPE U-SHAPE 47X51 STRL (DRAPES) ×3 IMPLANT
ELECT REM PT RETURN 15FT ADLT (MISCELLANEOUS) ×3 IMPLANT
GAUZE SPONGE 4X4 12PLY STRL (GAUZE/BANDAGES/DRESSINGS) ×3 IMPLANT
GAUZE XEROFORM 1X8 LF (GAUZE/BANDAGES/DRESSINGS) ×3 IMPLANT
GLOVE BIO SURGEON STRL SZ7.5 (GLOVE) ×3 IMPLANT
GLOVE BIOGEL M STRL SZ7.5 (GLOVE) ×2 IMPLANT
GLOVE BIOGEL PI IND STRL 8 (GLOVE) ×1 IMPLANT
GLOVE BIOGEL PI INDICATOR 8 (GLOVE) ×2
GOWN STRL REUS W/ TWL XL LVL3 (GOWN DISPOSABLE) ×1 IMPLANT
GOWN STRL REUS W/TWL XL LVL3 (GOWN DISPOSABLE) ×3
GUIDEWIRE .07X8 (WIRE) ×2 IMPLANT
KIT BASIN OR (CUSTOM PROCEDURE TRAY) ×3 IMPLANT
NDL HYPO 25X1 1.5 SAFETY (NEEDLE) IMPLANT
NEEDLE HYPO 25X1 1.5 SAFETY (NEEDLE) IMPLANT
NS IRRIG 1000ML POUR BTL (IV SOLUTION) ×3 IMPLANT
PENCIL SMOKE EVACUATOR (MISCELLANEOUS) ×3 IMPLANT
SCREW LN PT 40X4.5XST SLD (Screw) IMPLANT
SCREW LP 4.5X40MM (Screw) ×3 IMPLANT
STAPLER VISISTAT 35W (STAPLE) IMPLANT
STOCKINETTE 6  STRL (DRAPES) ×2
STOCKINETTE 6 STRL (DRAPES) IMPLANT
SUCTION FRAZIER HANDLE 10FR (MISCELLANEOUS) ×2
SUCTION TUBE FRAZIER 10FR DISP (MISCELLANEOUS) ×1 IMPLANT
SUT ETHILON 4 0 PS 2 18 (SUTURE) ×2 IMPLANT
SUT MNCRL AB 3-0 PS2 18 (SUTURE) IMPLANT
SUT MNCRL AB 4-0 PS2 18 (SUTURE) IMPLANT
SUT VIC AB 2-0 SH 27 (SUTURE)
SUT VIC AB 2-0 SH 27XBRD (SUTURE) IMPLANT
SYR BULB EAR ULCER 3OZ GRN STR (SYRINGE) ×3 IMPLANT
SYR CONTROL 10ML LL (SYRINGE) IMPLANT
TAP BONE CANN 4.5MM (BIT) ×2 IMPLANT
TRAY PREP A LATEX SAFE STRL (SET/KITS/TRAYS/PACK) IMPLANT
TUBING CONNECTING 10 (TUBING) ×2 IMPLANT
TUBING CONNECTING 10' (TUBING) ×1
UNDERPAD 30X36 HEAVY ABSORB (UNDERPADS AND DIAPERS) ×3 IMPLANT
YANKAUER SUCT BULB TIP NO VENT (SUCTIONS) IMPLANT

## 2019-10-20 NOTE — Anesthesia Postprocedure Evaluation (Signed)
Anesthesia Post Note  Patient: Hailey Moyer  Procedure(s) Performed: OPEN REDUCTION INTERNAL FIXATION (ORIF) METATARSAL (TOE) FRACTURE (Right Toe)     Patient location during evaluation: PACU Anesthesia Type: General Level of consciousness: awake and alert Pain management: pain level controlled Vital Signs Assessment: post-procedure vital signs reviewed and stable Respiratory status: spontaneous breathing, nonlabored ventilation, respiratory function stable and patient connected to nasal cannula oxygen Cardiovascular status: blood pressure returned to baseline and stable Postop Assessment: no apparent nausea or vomiting Anesthetic complications: no   No complications documented.  Last Vitals:  Vitals:   10/20/19 1515 10/20/19 1530  BP: 129/86 129/87  Pulse: 77 70  Resp: 12 11  Temp:    SpO2: 96% 96%    Last Pain:  Vitals:   10/20/19 1530  TempSrc:   PainSc: 0-No pain                 Trevor Iha

## 2019-10-20 NOTE — Telephone Encounter (Signed)
Hailey Moyer from [facility] LVM stating they did not receive insurance information for knee scooter. Fax insurance info to [phone].

## 2019-10-20 NOTE — Progress Notes (Signed)
Resent Rx order for knee scooter to [facility]. Faxed Pt's demographics, insurance, clinicals and order sheet to [phone] and alternate F# [phone]

## 2019-10-20 NOTE — Anesthesia Procedure Notes (Signed)
Anesthesia Regional Block: Popliteal block   Pre-Anesthetic Checklist: ,, timeout performed, Correct Patient, Correct Site, Correct Laterality, Correct Procedure, Correct Position, site marked, Risks and benefits discussed, pre-op evaluation,  At surgeon's request and post-op pain management  Laterality: Right  Prep: Maximum Sterile Barrier Precautions used, chloraprep       Needles:  Injection technique: Single-shot  Needle Type: Echogenic Needle     Needle Length: 9cm  Needle Gauge: 21     Additional Needles:   Procedures:,,,, ultrasound used (permanent image in chart),,,,  Narrative:  Start time: 10/20/2019 1:33 PM End time: 10/20/2019 1:40 PM Injection made incrementally with aspirations every 5 mL.  Performed by: Personally  Anesthesiologist: Trevor Iha, MD  Additional Notes: Block assessed. Patient tolerated procedure well.

## 2019-10-20 NOTE — Telephone Encounter (Signed)
Re-faxed [employer], demo, order and clinicals to [facility] at [phone]

## 2019-10-20 NOTE — Transfer of Care (Signed)
Immediate Anesthesia Transfer of Care Note  Patient: Hailey Moyer  Procedure(s) Performed: OPEN REDUCTION INTERNAL FIXATION (ORIF) METATARSAL (TOE) FRACTURE (Right Toe)  Patient Location: PACU  Anesthesia Type:GA combined with regional for post-op pain  Level of Consciousness: awake  Airway & Oxygen Therapy: Patient Spontanous Breathing and Patient connected to face mask oxygen  Post-op Assessment: Report given to RN and Post -op Vital signs reviewed and stable  Post vital signs: Reviewed and stable  Last Vitals:  Vitals Value Taken Time  BP 133/93 10/20/19 1500  Temp    Pulse 84 10/20/19 1501  Resp 11 10/20/19 1501  SpO2 98 % 10/20/19 1501  Vitals shown include unvalidated device data.  Last Pain:  Vitals:   10/20/19 1239  TempSrc: Oral         Complications: No complications documented.

## 2019-10-20 NOTE — Discharge Instructions (Signed)
  After Surgery Instructions   1) If you are recuperating from surgery anywhere other than home, please be sure to leave us the number where you can be reached.  2) Go directly home and rest.  3) Keep the operated foot(feet) elevated six inches above the hip when sitting or lying down. This will help control swelling and pain.  4) Support the elevated foot and leg with pillows. DO NOT PLACE PILLOWS UNDER THE KNEE.  5) DO NOT REMOVE or get your bandages WET, unless you were given different instructions by your doctor to do so. This increases the risk of infection.  6) Wear your surgical shoe or surgical boot at all times when you are up on your feet.  7) A limited amount of pain and swelling may occur. The skin may take on a bruised appearance. DO NOT BE ALARMED, THIS IS NORMAL.  8) For slight pain and swelling, apply an ice pack directly over the bandages for 15 minutes only out of each hour of the day. Continue until seen in the office for your first post op visit. DO NOT APPLY ANY FORM OF HEAT TO THE AREA.  9) Have prescriptions filled immediately and take as directed.  10) Drink lots of liquids, water and juice to stay hydrated.  11) CALL IMMEDIATELY IF:  *Bleeding continues until the following day of surgery  *Pain increases and/or does not respond to medication  *Bandages or cast appears to tight  *If your bandage gets wet  *Trip, fall or stump your surgical foot  *If your temperature goes above 101  *If you have ANY questions at all  12) You are expected to be non-weightbearing after your surgery.   If you need to reach the nurse for any reason, please call: [place]: [phone] [place]: [phone] [place]: [phone]  

## 2019-10-20 NOTE — Anesthesia Procedure Notes (Addendum)
Procedure Name: LMA Insertion Date/Time: 10/20/2019 2:12 PM Performed by: Florene Route, CRNA Patient Re-evaluated:Patient Re-evaluated prior to induction Oxygen Delivery Method: Circle system utilized Preoxygenation: Pre-oxygenation with 100% oxygen Induction Type: IV induction LMA: LMA with gastric port inserted LMA Size: 4.0 Number of attempts: 1 Airway Equipment and Method: Stylet Placement Confirmation: positive ETCO2 and breath sounds checked- equal and bilateral Tube secured with: Tape Dental Injury: Teeth and Oropharynx as per pre-operative assessment

## 2019-10-20 NOTE — H&P (Signed)
Anesthesia H&P Update: History and Physical Exam reviewed; patient is OK for planned anesthetic and procedure. ? ?

## 2019-10-20 NOTE — Progress Notes (Signed)
Assisted Dr. Druscilla Brownie with right popliteal block. Side rails up, monitors on throughout procedure. See vital signs in flow sheet. Tolerated Procedure well.

## 2019-10-20 NOTE — Op Note (Signed)
  Patient Name: Hailey Moyer DOB: [birth year]  MRN: [id number]   Date of Surgery: 10/20/2019  Surgeon: Dr. Ventura Sellers, DPM Assistants: Dr. Ovid Curd, DPM  Pre-operative Diagnosis:  Right 5th metatarsal fracture Post-operative Diagnosis:  Same Procedures:  1) ORIF Right 5th Metatarsal Pathology/Specimens: * No specimens in log * Anesthesia: General with LMA, REgional Hemostasis: * No tourniquets in log * Estimated Blood Loss: 5 mL Materials:  Implant Name Type Inv. Item Serial No. Manufacturer Lot No. LRB No. Used Action  SCREW LP [id number] - [id number] Screw SCREW LP [id number]  [employer]  Right 1 Implanted   Medications: none Complications: none  Indications for Procedure:  This is a 59 y.o. female with a right 5th metatarsal fracture. She walked on the fracture causing it to displace. She presents for correction. All risks, benefits, and alternatives of surgery were discussed. No guarantees given.   Procedure in Detail: Patient was identified in pre-operative holding area. Formal consent was signed and the right lower extremity was marked. Patient was brought back to the operating room. Anesthesia was induced. The extremity was prepped and draped in the usual sterile fashion. Timeout was taken to confirm patient name, laterality, and procedure prior to incision.   Attention was then directed to the lateral foot. The metatarsal fracture was identified. Fluoroscopy was used to triangulate proper pin positioning for the screw. The k wire for the screw was placed in proper position across the fracture and positioning was checked. The wire was overdrilled for a 4.0 screw. The bone was noted to be very soft. The instrumentation was removed and the screw was placed across the fracture site. Fluoroscopy was used and the bone did appear to compress and the screw was noted to be in good position.  The wound was irrigated. The incision was then closed with 4-0 nylon. The foot was then  dressed with xeroform, 4x4, kerlix, and ACE. Patient tolerated the procedure well.   Dr. Ardelle Anton was scrubbed and present for the entirety of the procedure, and was medically necessary to assist in positioning, wire placement and placement of hardware.  Disposition: Following a period of post-operative monitoring, patient will be transferred home.

## 2019-10-21 ENCOUNTER — Telehealth: Payer: Self-pay | Admitting: Podiatry

## 2019-10-21 ENCOUNTER — Encounter (HOSPITAL_COMMUNITY): Payer: Self-pay | Admitting: Podiatry

## 2019-10-21 NOTE — Telephone Encounter (Signed)
Called patient for post-op check doing well foot is still numb from the block. Did not get knee scooter. Encouraged her to have her sister go today as the order was sent a third time yesterday

## 2019-10-21 NOTE — Addendum Note (Signed)
Addendum  created 10/21/19 0759 by Elisabeth Cara, CRNA   Intraprocedure Event edited

## 2019-10-26 ENCOUNTER — Telehealth: Payer: Self-pay

## 2019-10-26 NOTE — Telephone Encounter (Signed)
Pt called and LVM stating she has been having the same type of pins&needles and tinging for 4 straight days. Pt also stated her leg is cold to the touch, with fever and chills. Pt states she has been taking her Abx and keeping her foot up. Pt would like to know any recommendations from the Dr.

## 2019-10-28 ENCOUNTER — Encounter: Payer: Medicaid Other | Admitting: Podiatry

## 2019-10-29 ENCOUNTER — Ambulatory Visit (INDEPENDENT_AMBULATORY_CARE_PROVIDER_SITE_OTHER): Payer: Medicaid Other

## 2019-10-29 ENCOUNTER — Other Ambulatory Visit: Payer: Self-pay

## 2019-10-29 ENCOUNTER — Ambulatory Visit (INDEPENDENT_AMBULATORY_CARE_PROVIDER_SITE_OTHER): Payer: Medicaid Other | Admitting: Sports Medicine

## 2019-10-29 ENCOUNTER — Encounter: Payer: Self-pay | Admitting: Sports Medicine

## 2019-10-29 VITALS — BP 105/55 | HR 78 | Temp 97.8°F

## 2019-10-29 DIAGNOSIS — E559 Vitamin D deficiency, unspecified: Secondary | ICD-10-CM

## 2019-10-29 DIAGNOSIS — S92351D Displaced fracture of fifth metatarsal bone, right foot, subsequent encounter for fracture with routine healing: Secondary | ICD-10-CM

## 2019-10-29 DIAGNOSIS — R600 Localized edema: Secondary | ICD-10-CM

## 2019-10-29 DIAGNOSIS — G894 Chronic pain syndrome: Secondary | ICD-10-CM

## 2019-10-29 DIAGNOSIS — Z9889 Other specified postprocedural states: Secondary | ICD-10-CM

## 2019-10-29 MED ORDER — OXYCODONE-ACETAMINOPHEN 10-325 MG PO TABS: 1.0000 | ORAL_TABLET | Freq: Four times a day (QID) | ORAL | 0 refills | Status: DC | PRN

## 2019-10-29 NOTE — Progress Notes (Signed)
Subjective: Hailey Moyer is a 59 y.o. female patient seen today in office for POV #1  (DOS 10-20-19), S/P ORIF Right 5th met fracture performed by Dr. March Rummage. Patient admits some pain at surgical site, denies calf pain, denies headache, chest pain, shortness of breath, nausea, vomiting, or chills. But does admit one episode of feeling feverish 2 days ago. No other issues noted.   Patient Active Problem List   Diagnosis Date Noted  . Closed fracture of base of fifth metatarsal bone   . Hallux valgus, left 09/09/2018  . Ankle contracture, left 07/29/2018  . Plantar fasciitis of left foot 07/29/2018  . Mid back pain 01/01/2017  . Chronic bilateral low back pain with left-sided sciatica 01/01/2017  . Chronic pain syndrome 01/01/2017    Current Outpatient Medications on File Prior to Visit  Medication Sig Dispense Refill  . amLODipine (NORVASC) 10 MG tablet Take 10 mg by mouth daily.    . Aspirin-Acetaminophen-Caffeine (EXCEDRIN MIGRAINE PO) Take 1 tablet by mouth daily as needed (Migraines).     . cephALEXin (KEFLEX) 500 MG capsule Take 1 capsule (500 mg total) by mouth 4 (four) times daily for 10 days. 40 capsule 0  . dicyclomine (BENTYL) 20 MG tablet Take 1 tablet (20 mg total) by mouth 2 (two) times daily as needed for spasms. 60 tablet 6  . ibuprofen (ADVIL) 200 MG tablet Take 800 mg by mouth every 6 (six) hours as needed for mild pain or moderate pain.    Marland Kitchen lisinopril (PRINIVIL,ZESTRIL) 10 MG tablet Take 10 mg by mouth daily.     . meloxicam (MOBIC) 15 MG tablet TAKE 1 TABLET BY MOUTH DAILY (Patient taking differently: Take 15 mg by mouth daily as needed for pain. ) 90 tablet 1  . ondansetron (ZOFRAN) 4 MG tablet Take 1 tablet (4 mg total) by mouth every 8 (eight) hours as needed for nausea or vomiting. 20 tablet 0  . oxyCODONE (OXY IR/ROXICODONE) 5 MG immediate release tablet Take 5 mg by mouth every 6 (six) hours as needed for severe pain.     Marland Kitchen oxyCODONE-acetaminophen (PERCOCET) 5-325 MG  tablet Take 1 tablet by mouth every 4 (four) hours as needed for severe pain. 20 tablet 0  . pantoprazole (PROTONIX) 40 MG tablet Take 1 tablet (40 mg total) by mouth daily. 30 tablet 6  . PROAIR HFA 108 (90 Base) MCG/ACT inhaler Inhale 2 puffs into the lungs daily as needed for wheezing or shortness of breath.     . promethazine (PHENERGAN) 25 MG tablet Take 25 mg by mouth daily as needed (Migraines).     . Vitamin D, Ergocalciferol, (DRISDOL) 1.25 MG (50000 UNIT) CAPS capsule Take 1 capsule (50,000 Units total) by mouth every 7 (seven) days. 5 capsule 1   No current facility-administered medications on file prior to visit.    Allergies  Allergen Reactions  . Sulfa Antibiotics Rash    TONGUE TURNED WHITE    Objective: There were no vitals filed for this visit.  General: No acute distress, AAOx3  Right foot: Suture intact with no gapping or dehiscence at surgical site, mild swelling to right lateral foot, no erythema, no warmth, no drainage, no signs of infection noted, Capillary fill time <3 seconds in all digits, gross sensation present via light touch to right foot. Mild pain and guardin with range of motion right foot.  No pain with calf compression.   Post Op Xray, Right 5th met Hardware intact. Soft tissue swelling within normal limits  for post op status.   Assessment and Plan:  Problem List Items Addressed This Visit      Musculoskeletal and Integument   Closed fracture of base of fifth metatarsal bone - Primary     Other   Chronic pain syndrome    Other Visit Diagnoses    Vitamin D deficiency       Localized edema       S/P foot surgery, right          -Patient seen and evaluated -Xrays reviewed  -Applied dry sterile dressing to surgical site right foot secured with ACE wrap and stockinet  -Advised patient to make sure to keep dressings clean, dry, and intact to right surgical site,  -Advised patient to continue with NWB with use of crutches on right or  wheelchair -Advised patient to limit activity to necessity  -Advised patient to ice and elevate as instructed and continue with PRN meds; Refill sent to pharmacy for Dupont heel cushion to left shoe since patient is c/o hip and left leg tiredness and pain while using crutches -Will plan for possible suture removal at next office visit with Dr. March Rummage. In the meantime, patient to call office if any issues or problems arise.   Landis Martins, DPM

## 2019-10-29 NOTE — Telephone Encounter (Signed)
Patient seen today by Dr. Marylene Land

## 2019-11-04 ENCOUNTER — Encounter: Payer: Self-pay | Admitting: Podiatry

## 2019-11-04 ENCOUNTER — Ambulatory Visit (INDEPENDENT_AMBULATORY_CARE_PROVIDER_SITE_OTHER): Payer: Medicaid Other | Admitting: Podiatry

## 2019-11-04 ENCOUNTER — Other Ambulatory Visit: Payer: Self-pay

## 2019-11-04 DIAGNOSIS — S92351D Displaced fracture of fifth metatarsal bone, right foot, subsequent encounter for fracture with routine healing: Secondary | ICD-10-CM

## 2019-11-04 DIAGNOSIS — Z9889 Other specified postprocedural states: Secondary | ICD-10-CM

## 2019-11-04 MED ORDER — OXYCODONE-ACETAMINOPHEN 5-325 MG PO TABS: 1.0000 | ORAL_TABLET | ORAL | 0 refills | Status: DC | PRN

## 2019-11-04 NOTE — Progress Notes (Signed)
  Subjective:  Patient ID: Hailey Moyer, female    DOB: [birth year],  MRN: [id number]  Chief Complaint  Patient presents with  . Routine Post Op    i am hurting in the right ankle area and i have been using cruthes     DOS: 10/20/19 Procedure: ORIF Right 5th metatarsal  59 y.o. female presents with the above complaint. History confirmed with patient.  Objective:  Physical Exam: tenderness at the surgical site, local edema noted and calf supple, nontender. Incision: healing well, no significant drainage, no dehiscence Assessment:   1. Closed displaced fracture of fifth metatarsal bone of right foot with routine healing, subsequent encounter   2. S/P foot surgery, right     Plan:  Patient was evaluated and treated and all questions answered.  Post-operative State -Sutures removed -Ok to start showering at this time. Advised they cannot soak. -NWB with crutches  -Pain medication refilled.  No follow-ups on file.

## 2019-11-18 ENCOUNTER — Ambulatory Visit (INDEPENDENT_AMBULATORY_CARE_PROVIDER_SITE_OTHER): Payer: Medicaid Other | Admitting: Podiatry

## 2019-11-18 ENCOUNTER — Other Ambulatory Visit: Payer: Self-pay

## 2019-11-18 ENCOUNTER — Ambulatory Visit (INDEPENDENT_AMBULATORY_CARE_PROVIDER_SITE_OTHER): Payer: Medicaid Other

## 2019-11-18 DIAGNOSIS — M7672 Peroneal tendinitis, left leg: Secondary | ICD-10-CM | POA: Diagnosis not present

## 2019-11-18 DIAGNOSIS — S92351D Displaced fracture of fifth metatarsal bone, right foot, subsequent encounter for fracture with routine healing: Secondary | ICD-10-CM

## 2019-11-18 NOTE — Progress Notes (Signed)
  Subjective:  Patient ID: Hailey Moyer, female    DOB: 1960-09-03,  MRN: [id number]  No chief complaint on file.   DOS: 10/20/19 Procedure: ORIF Right 5th metatarsal  59 y.o. female presents with the above complaint. History confirmed with patient.  Objective:  Physical Exam: no tenderness at the surgical site and no edema noted.  Tenderness to palpation about the ATFL and peroneal tendons Incision: Well-healed  Radiographs: Xr taken and reviewed good osseous bridging of hte fracture site, hardware intact good positioning Assessment:   1. Closed displaced fracture of fifth metatarsal bone of right foot with routine healing, subsequent encounter     Plan:  Patient was evaluated and treated and all questions answered.  Post-operative State -XR taken and reviewed. Good osseous healing noted. Transition to normal shoegear with Lace up ankle brace. Rx written for hanger.  Peroneal tendinitis, ankle ligament insufficicency -Immobilize in brace consider MRI if remains symptomatic.  Return in about 1 month (around 12/18/2019).

## 2019-12-03 ENCOUNTER — Telehealth: Payer: Self-pay | Admitting: Podiatry

## 2019-12-03 NOTE — Telephone Encounter (Signed)
Patient says the screws feel like they are coming through the skin and sharp pains going through the toes. She wanted to know if this normal. She also said the bottom of her foot is hurting.

## 2019-12-06 ENCOUNTER — Telehealth: Payer: Self-pay | Admitting: Podiatry

## 2019-12-06 NOTE — Telephone Encounter (Signed)
Dr Allena Katz is on call, forwarding to him

## 2019-12-06 NOTE — Telephone Encounter (Signed)
Price pt-Pt req Refill for pain meds-says what you gave her doesn't help much.  Wanted you to know she tries to wear brace but makes top of foot hurt worse & also that she ran over other foot with wheelchair so now it hurts too. [facility]

## 2019-12-07 ENCOUNTER — Other Ambulatory Visit: Payer: Self-pay | Admitting: Podiatry

## 2019-12-07 MED ORDER — OXYCODONE-ACETAMINOPHEN 5-325 MG PO TABS: 1.0000 | ORAL_TABLET | ORAL | 0 refills | Status: DC | PRN

## 2019-12-07 NOTE — Telephone Encounter (Signed)
This is a Dr. Samuella Cota patient, but he is on vacation. Please advise

## 2019-12-07 NOTE — Addendum Note (Signed)
Addended by: Nicholes Rough on: 12/07/2019 04:30 AM   Modules accepted: Orders

## 2019-12-08 ENCOUNTER — Other Ambulatory Visit: Payer: Self-pay | Admitting: Podiatry

## 2019-12-08 NOTE — Telephone Encounter (Signed)
Please advise. Dr. Samuella Cota is on vacation, Thanks

## 2019-12-10 ENCOUNTER — Encounter: Payer: Self-pay | Admitting: Neurology

## 2019-12-14 ENCOUNTER — Ambulatory Visit: Payer: Medicaid Other | Admitting: Neurology

## 2019-12-20 ENCOUNTER — Encounter: Payer: Self-pay | Admitting: Podiatry

## 2019-12-20 ENCOUNTER — Ambulatory Visit (INDEPENDENT_AMBULATORY_CARE_PROVIDER_SITE_OTHER): Payer: Medicaid Other | Admitting: Podiatry

## 2019-12-20 ENCOUNTER — Other Ambulatory Visit: Payer: Self-pay

## 2019-12-20 ENCOUNTER — Ambulatory Visit (INDEPENDENT_AMBULATORY_CARE_PROVIDER_SITE_OTHER): Payer: Medicaid Other

## 2019-12-20 DIAGNOSIS — Z9889 Other specified postprocedural states: Secondary | ICD-10-CM | POA: Diagnosis not present

## 2019-12-20 DIAGNOSIS — S99922A Unspecified injury of left foot, initial encounter: Secondary | ICD-10-CM

## 2019-12-20 DIAGNOSIS — G5763 Lesion of plantar nerve, bilateral lower limbs: Secondary | ICD-10-CM

## 2019-12-20 NOTE — Progress Notes (Signed)
  Subjective:  Patient ID: Hailey Moyer, female    DOB: 1960-07-11,  MRN: [id number]  Chief Complaint  Patient presents with  . Foot Problem    both of my feet are killing me and i hurt the left foot with a wheel chair     DOS: 10/20/19 Procedure: ORIF Right 5th metatarsal  59 y.o. female presents with the above complaint. History confirmed with patient. States she has more pain in the ball of the feet than anywhere else.  Objective:  Physical Exam: no tenderness at the surgical site and no edema noted.  Tenderness to palpation about the 3rd interspace bilat with Mulder's click. Incision: Well-healed  Radiographs: Xr taken and reviewed full healing of the fracture without hardware failure. No other acute fracture. Assessment:   1. Injury, foot, left, initial encounter   2. S/P foot surgery, right   3. Morton's neuroma of both feet     Plan:  Patient was evaluated and treated and all questions answered.  Post-operative State -XR reviewed with patient. -Well healed  Morton Neuroma bilat -Educated on etiology -Interspace injection delivered as below. -Educated on padding and proper shoegear  Procedure: Neuroma Injection Location: Bilateral 3rd interspace Skin Prep: Alcohol. Injectate: 0.5 cc 0.5% marcaine plain, 0.5 cc celestone Disposition: Patient tolerated procedure well. Injection site dressed with a band-aid.  Return in about 1 month (around 01/19/2020) for Neuroma, Bilateral.

## 2020-01-24 ENCOUNTER — Other Ambulatory Visit: Payer: Self-pay

## 2020-01-24 ENCOUNTER — Ambulatory Visit: Payer: Medicaid Other | Admitting: Podiatry

## 2020-01-24 ENCOUNTER — Encounter: Payer: Self-pay | Admitting: Podiatry

## 2020-01-24 DIAGNOSIS — G5763 Lesion of plantar nerve, bilateral lower limbs: Secondary | ICD-10-CM | POA: Diagnosis not present

## 2020-01-24 MED ORDER — BETAMETHASONE SOD PHOS & ACET 6 (3-3) MG/ML IJ SUSP
6.0000 mg | Freq: Once | INTRAMUSCULAR | Status: AC
  Administered 2020-01-24: 6 mg

## 2020-01-24 NOTE — Progress Notes (Signed)
  Subjective:  Patient ID: Hailey Moyer, female    DOB: January 05, 1961,  MRN: [id number]  Chief Complaint  Patient presents with  . Foot Problem    i am doing alot better and the left foot does hurt some and hurts to put weight on it (toes)   DOS: 10/20/19 Procedure: ORIF Right 5th metatarsal  59 y.o. female presents with the above complaint. History confirmed with patient. States the injection has helped.  Objective:  Physical Exam: no tenderness at the surgical site and no edema noted.  Tenderness to palpation about the 3rd interspace bilat with Mulder's click. Incision: Well-healed  Assessment:   1. Morton's neuroma of both feet    Plan:  Patient was evaluated and treated and all questions answered.  Post-operative State -Well healed  Morton Neuroma bilat -Repeat injection as below  Procedure: Neuroma Injection Location: Bilateral 3rd interspace Skin Prep: Alcohol. Injectate: 0.5 cc 0.5% marcaine plain, 0.5 cc dexamethasone phosphate. Disposition: Patient tolerated procedure well. Injection site dressed with a band-aid.  No follow-ups on file.

## 2020-02-24 ENCOUNTER — Ambulatory Visit: Payer: Medicaid Other | Admitting: Neurology

## 2020-02-28 ENCOUNTER — Ambulatory Visit (INDEPENDENT_AMBULATORY_CARE_PROVIDER_SITE_OTHER): Payer: Medicaid Other | Admitting: Podiatry

## 2020-02-28 ENCOUNTER — Other Ambulatory Visit: Payer: Self-pay

## 2020-02-28 DIAGNOSIS — Z9889 Other specified postprocedural states: Secondary | ICD-10-CM | POA: Diagnosis not present

## 2020-02-28 DIAGNOSIS — G5763 Lesion of plantar nerve, bilateral lower limbs: Secondary | ICD-10-CM

## 2020-02-28 DIAGNOSIS — S99922A Unspecified injury of left foot, initial encounter: Secondary | ICD-10-CM | POA: Diagnosis not present

## 2020-02-28 NOTE — Progress Notes (Signed)
  Subjective:  Patient ID: Hailey Moyer, female    DOB: February 28, 1960,  MRN: [id number]  Chief Complaint  Patient presents with  . Neuroma    Fu bilat neuroma- injection helped, pt stats has sharpe pain behind toes on right foot the other day pain was a7/10. Tx resting and evelation    DOS: 10/20/19 Procedure: ORIF Right 5th metatarsal  60 y.o. female presents with the above complaint. History confirmed with patient. States the feet no longer hurt and she is only having left knee/leg pain.  Objective:  Physical Exam: no tenderness at the surgical site and no edema noted.  No tenderness to palpation about the 3rd interspace bilat with Mulder's click. Incision: Well-healed  Assessment:   1. Morton's neuroma of both feet   2. Injury, foot, left, initial encounter   3. S/P foot surgery, right    Plan:  Patient was evaluated and treated and all questions answered.  Post-operative State -Well healed  Morton Neuroma bilat -No pain today. No repeat injection needed today  Recc f/u for left knee pain.  No follow-ups on file.

## 2020-03-16 ENCOUNTER — Other Ambulatory Visit: Payer: Self-pay

## 2020-03-16 DIAGNOSIS — K219 Gastro-esophageal reflux disease without esophagitis: Secondary | ICD-10-CM

## 2020-03-16 DIAGNOSIS — R1011 Right upper quadrant pain: Secondary | ICD-10-CM

## 2020-03-16 MED ORDER — PANTOPRAZOLE SODIUM 40 MG PO TBEC: 40.0000 mg | DELAYED_RELEASE_TABLET | Freq: Every day | ORAL | 6 refills | Status: DC

## 2020-03-16 NOTE — Progress Notes (Signed)
rx request 

## 2020-04-17 NOTE — Progress Notes (Deleted)
NEUROLOGY CONSULTATION NOTE  Hailey Moyer MRN: [id number] DOB: January 29, 1961  Referring provider: *** Primary care provider: ***  Reason for consult:  Chronic headaches  Assessment/Plan:   ***   Subjective:  Hailey Moyer is a 60 year old ***-handed female with HTN, asthma, arthritis, chronic neck pain, fibromyalgia and history of isolated seizure during pregnancy who presents for chronic headaches.  History supplemented by referring provider's note.  Onset:  *** Location:  *** Quality:  *** Intensity:  ***.  *** denies new headache, thunderclap headache or severe headache that wakes *** from sleep. Aura:  *** Prodrome:  *** Postdrome:  *** Associated symptoms:  ***.  *** denies associated unilateral numbness or weakness. Duration:  *** Frequency:  *** Frequency of abortive medication: *** Triggers:  *** Relieving factors:  *** Activity:  ***  Current NSAIDS/analgesics:  Ibuprofen 800mg  Current triptans:  Rizatriptan 10mg  Current ergotamine:  none Current anti-emetic:  Zofran 4mg  Current muscle relaxants:  Flexeril Current Antihypertensive medications:  Amlodipine, lisinopril Current Antidepressant medications:  none Current Anticonvulsant medications:  none Current anti-CGRP:  none Current Vitamins/Herbal/Supplements:  none Current Antihistamines/Decongestants:  none Other therapy:  none Hormone/birth control:  none  Past NSAIDS/analgesics:  *** Past abortive triptans:  *** Past abortive ergotamine:  *** Past muscle relaxants:  *** Past anti-emetic:  Promethazine 5mg  Past antihypertensive medications:  *** Past antidepressant medications:  *** Past anticonvulsant medications:  *** Past anti-CGRP:  *** Past vitamins/Herbal/Supplements:  *** Past antihistamines/decongestants:  *** Other past therapies:  ***  Caffeine:  *** Alcohol:  *** Smoker:  *** Diet:  *** Exercise:  *** Depression:  ***; Anxiety:  *** Other pain:  *** Sleep hygiene:   *** Family history of headache:  ***      PAST MEDICAL HISTORY: Past Medical History:  Diagnosis Date  . Abnormal mammogram    2 DARK AREAS IN RIGHT BREAST TISSUE ON 08-2019 FOLLOW UP IN 6 MONTHS  . Anxiety   . Arthritis   . Asthma   . Chronic neck pain   . COVID-19 02/18/2019   SYMPTOMS X 2 WEEKS ACHED ALL OVER, FEVER, ALL SYMPTOMS RESOLVED  . Depression   . Elevated cholesterol   . Fatty liver   . Fibromyalgia   . Gallstone   . Ganglion cyst of wrist    RIGHT  . GERD (gastroesophageal reflux disease)   . Hiatal hernia   . History of angina    5 TO 6 YRS AGO, SAW CARDIOLOGY AND RELEASED, NO ANGINA 5 TO 6 YRS  . History of kidney stones    LEFT KIDNEY  . History of seizure 1992   X 1 WHILE PREGNANT NONE SINCE  . HTN (hypertension), benign   . Illiterate   . Migraine   . Other physeal fracture of right metatarsal, initial encounter for closed fracture    WEARS BOOT  . UTI (urinary tract infection)     PAST SURGICAL HISTORY: Past Surgical History:  Procedure Laterality Date  . APPENDECTOMY  AGE 18  . CHOLECYSTECTOMY  2015  . COLONOSCOPY  06/01/2014   Mild diverticulosis. Otherwise normal colonsocopy.   . COLPOSCOPY  2015   OF CERVIX  . CRYOABLATION  1980   CERVIX  . Cyst removed from shoulder Left YRS AGO  . ESOPHAGOGASTRODUODENOSCOPY  10/25/2015   Mild gastritis. Small hiatal hernia. Gastric polyps status post polypectomy x 2.   . HERNIA REPAIR  YRS AGO   [employer]   . ORIF TOE FRACTURE Right 10/20/2019  Procedure: OPEN REDUCTION INTERNAL FIXATION (ORIF) METATARSAL (TOE) FRACTURE;  Surgeon: Park Liter, DPM;  Location: [facility];  Service: Podiatry;  Laterality: Right;  Regional, LMA - Popliteal block  . URETHRA SURGERY  1980'S    MEDICATIONS: Current Outpatient Medications on File Prior to Visit  Medication Sig Dispense Refill  . amLODipine (NORVASC) 10 MG tablet Take 10 mg by mouth daily.    . Aspirin-Acetaminophen-Caffeine (EXCEDRIN MIGRAINE PO) Take 1  tablet by mouth daily as needed (Migraines).     . ciprofloxacin (CIPRO) 500 MG tablet Take 500 mg by mouth 2 (two) times daily.    . cyclobenzaprine (FLEXERIL) 10 MG tablet Take 10 mg by mouth 2 (two) times daily as needed.    . dicyclomine (BENTYL) 20 MG tablet Take 1 tablet (20 mg total) by mouth 2 (two) times daily as needed for spasms. 60 tablet 6  . ibuprofen (ADVIL) 200 MG tablet Take 800 mg by mouth every 6 (six) hours as needed for mild pain or moderate pain.    Marland Kitchen ibuprofen (ADVIL) 800 MG tablet Take by mouth at bedtime.    Marland Kitchen lisinopril (PRINIVIL,ZESTRIL) 10 MG tablet Take 10 mg by mouth daily.     . meloxicam (MOBIC) 15 MG tablet TAKE 1 TABLET BY MOUTH DAILY (Patient taking differently: Take 15 mg by mouth daily as needed for pain. ) 90 tablet 1  . ondansetron (ZOFRAN) 4 MG tablet Take 1 tablet (4 mg total) by mouth every 8 (eight) hours as needed for nausea or vomiting. 20 tablet 0  . oxyCODONE (OXY IR/ROXICODONE) 5 MG immediate release tablet Take 5 mg by mouth every 6 (six) hours as needed for severe pain.     Marland Kitchen oxyCODONE-acetaminophen (PERCOCET) 5-325 MG tablet Take 1 tablet by mouth every 4 (four) hours as needed for severe pain. 20 tablet 0  . oxyCODONE-acetaminophen (PERCOCET) 5-325 MG tablet Take 1-2 tablets by mouth every 4 (four) hours as needed for severe pain. 15 tablet 0  . pantoprazole (PROTONIX) 40 MG tablet Take 1 tablet (40 mg total) by mouth daily. 30 tablet 6  . phenazopyridine (PYRIDIUM) 200 MG tablet Take 200 mg by mouth 3 (three) times daily.    Marland Kitchen PROAIR HFA 108 (90 Base) MCG/ACT inhaler Inhale 2 puffs into the lungs daily as needed for wheezing or shortness of breath.     . promethazine (PHENERGAN) 25 MG tablet Take 25 mg by mouth daily as needed (Migraines).     . Vitamin D, Ergocalciferol, (DRISDOL) 1.25 MG (50000 UNIT) CAPS capsule Take 1 capsule (50,000 Units total) by mouth every 7 (seven) days. 5 capsule 1   No current facility-administered medications on file  prior to visit.    ALLERGIES: Allergies  Allergen Reactions  . Sulfa Antibiotics Rash    TONGUE TURNED WHITE    FAMILY HISTORY: Family History  Problem Relation Age of Onset  . Diabetes Mother   . Heart disease Mother   . Diabetes Sister   . Hypertension Sister   . Kidney cancer Father   . Colon cancer Neg Hx   . Esophageal cancer Neg Hx    ***.  Objective:  *** General: No acute distress.  Patient appears well-groomed.   Head:  Normocephalic/atraumatic Eyes:  fundi examined but not visualized Neck: supple, no paraspinal tenderness, full range of motion Back: No paraspinal tenderness Heart: regular rate and rhythm Lungs: Clear to auscultation bilaterally. Vascular: No carotid bruits. Neurological Exam: Mental status: alert and oriented to person, place,  and time, recent and remote memory intact, fund of knowledge intact, attention and concentration intact, speech fluent and not dysarthric, language intact. Cranial nerves: CN I: not tested CN II: pupils equal, round and reactive to light, visual fields intact CN III, IV, VI:  full range of motion, no nystagmus, no ptosis CN V: facial sensation intact. CN VII: upper and lower face symmetric CN VIII: hearing intact CN IX, X: gag intact, uvula midline CN XI: sternocleidomastoid and trapezius muscles intact CN XII: tongue midline Bulk & Tone: normal, no fasciculations. Motor:  muscle strength 5/5 throughout Sensation:  Pinprick, temperature and vibratory sensation intact. Deep Tendon Reflexes:  2+ throughout,  toes downgoing.   Finger to nose testing:  Without dysmetria.   Heel to shin:  Without dysmetria.   Gait:  Normal station and stride.  Romberg negative.    Thank you for allowing me to take part in the care of this patient.  Shon Millet, DO  CC: ***

## 2020-04-18 ENCOUNTER — Ambulatory Visit: Payer: Medicaid Other | Admitting: Neurology

## 2020-05-26 ENCOUNTER — Encounter: Payer: Self-pay | Admitting: Gastroenterology

## 2020-05-26 ENCOUNTER — Other Ambulatory Visit (INDEPENDENT_AMBULATORY_CARE_PROVIDER_SITE_OTHER): Payer: Medicaid Other

## 2020-05-26 ENCOUNTER — Ambulatory Visit (INDEPENDENT_AMBULATORY_CARE_PROVIDER_SITE_OTHER): Payer: Medicaid Other | Admitting: Gastroenterology

## 2020-05-26 ENCOUNTER — Other Ambulatory Visit: Payer: Self-pay

## 2020-05-26 VITALS — HR 64 | Ht 67.0 in | Wt 203.1 lb

## 2020-05-26 DIAGNOSIS — R1011 Right upper quadrant pain: Secondary | ICD-10-CM | POA: Diagnosis not present

## 2020-05-26 DIAGNOSIS — K760 Fatty (change of) liver, not elsewhere classified: Secondary | ICD-10-CM

## 2020-05-26 DIAGNOSIS — K581 Irritable bowel syndrome with constipation: Secondary | ICD-10-CM

## 2020-05-26 DIAGNOSIS — K449 Diaphragmatic hernia without obstruction or gangrene: Secondary | ICD-10-CM | POA: Diagnosis not present

## 2020-05-26 DIAGNOSIS — K219 Gastro-esophageal reflux disease without esophagitis: Secondary | ICD-10-CM | POA: Diagnosis not present

## 2020-05-26 LAB — COMPREHENSIVE METABOLIC PANEL
ALT: 43 U/L — ABNORMAL HIGH (ref 0–35)
AST: 33 U/L (ref 0–37)
Albumin: 4.4 g/dL (ref 3.5–5.2)
Alkaline Phosphatase: 94 U/L (ref 39–117)
BUN: 7 mg/dL (ref 6–23)
CO2: 30 mEq/L (ref 19–32)
Calcium: 9.9 mg/dL (ref 8.4–10.5)
Chloride: 102 mEq/L (ref 96–112)
Creatinine, Ser: 0.7 mg/dL (ref 0.40–1.20)
GFR: 93.6 mL/min (ref >60.00)
Glucose, Bld: 68 mg/dL — ABNORMAL LOW (ref 70–99)
Potassium: 3.6 mEq/L (ref 3.5–5.1)
Sodium: 140 mEq/L (ref 135–145)
Total Bilirubin: 0.5 mg/dL (ref 0.2–1.2)
Total Protein: 7 g/dL (ref 6.0–8.3)

## 2020-05-26 LAB — CBC WITH DIFFERENTIAL/PLATELET
Basophils Absolute: 0.1 10*3/uL (ref 0.0–0.1)
Basophils Relative: 1.3 % (ref 0.0–3.0)
Eosinophils Absolute: 0.8 10*3/uL — ABNORMAL HIGH (ref 0.0–0.7)
Eosinophils Relative: 9.3 % — ABNORMAL HIGH (ref 0.0–5.0)
HCT: 41.7 % (ref 36.0–46.0)
Hemoglobin: 13.8 g/dL (ref 12.0–15.0)
Lymphocytes Relative: 39.7 % (ref 12.0–46.0)
Lymphs Abs: 3.2 10*3/uL (ref 0.7–4.0)
MCHC: 33.1 g/dL (ref 30.0–36.0)
MCV: 91.5 fl (ref 78.0–100.0)
Monocytes Absolute: 0.9 10*3/uL (ref 0.1–1.0)
Monocytes Relative: 10.8 % (ref 3.0–12.0)
Neutro Abs: 3.2 10*3/uL (ref 1.4–7.7)
Neutrophils Relative %: 38.9 % — ABNORMAL LOW (ref 43.0–77.0)
Platelets: 340 10*3/uL (ref 150.0–400.0)
RBC: 4.55 Mil/uL (ref 3.87–5.11)
RDW: 13.1 % (ref 11.5–15.5)
WBC: 8.1 10*3/uL (ref 4.0–10.5)

## 2020-05-26 LAB — HIGH SENSITIVITY CRP: CRP, High Sensitivity: 2.23 mg/L (ref 0.000–5.000)

## 2020-05-26 LAB — TSH: TSH: 3.01 u[IU]/mL (ref 0.35–4.50)

## 2020-05-26 LAB — LIPASE: Lipase: 22 U/L (ref 11.0–59.0)

## 2020-05-26 MED ORDER — PANTOPRAZOLE SODIUM 40 MG PO TBEC: 40.0000 mg | DELAYED_RELEASE_TABLET | Freq: Every day | ORAL | 3 refills | Status: DC

## 2020-05-26 NOTE — Progress Notes (Signed)
Chief Complaint: Abdominal pain  Referring Provider:  Pc, [facility]*      ASSESSMENT AND PLAN;   #1. RUQ pain -likely musculoskeletal, could be related to back pain/fibromyalgia.  Has reproducible muscle tenderness (pos Carnett's). Neg CT abdo/pel 02/2019, 2019 neg except for fatty liver, neg [place] 03/23/2018, 12/2016, neg EGD 10/2015, UGI series, HIDA with EF but had symptoms, s/p cholecystectomy without pain relief. #2. IBS with constipation.  Now with diarrhea.  Neg colon 05/2014 #3. GERD with small HH on EGD 10/2015. Neg SB Bx #4. Fatty liver.  Plan: - Continue protonix 18m po qd #30, 6 refills. - CBC, CMP, CRP, lipase, TSH and celiac screen - check stool for GI pathogens, ova and parasites, calprotectin.  - EGD with dil/colon - Encouraged her to lose weight as the only treatment for fatty liver for now.     HPI:    Hailey Moyer a 60y.o. female   For follow-up visit  -Has previous history of constipation.  Lately has been having watery bowel movements.  No recent antibiotics.  No recent history of travel.  -Has chronic RUQ pain x 7-8 yrs, which she has noticed gets better with heating pads.  This is getting worse.  She has been having occasional dysphagia mostly to solids-mid chest with associated heartburn.  Protonix does help with heartburn. Extensive GI evaluation-including multiple ultrasounds, CT scans, and EGD which was negative for etiology.  Multiple scans did show fatty liver.  Denies having any melena or hematochezia  Abdominal pain is mostly in the right upper quadrant and back-exacerbated by walking, sitting for a longer time, exercise.  No nausea, vomiting, odynophagia.  Has been to multiple gastroenterologists.  Had Covid 19 01/2019  Recent review:  CT Abdo/pelvis with p.o. and IV contrast 03/15/2019: Fatty liver, SP cholecystectomy, moderate DJD both hips, small right renal calculus, aortic atherosclerosis.  Report sent for  scanning.  CBC 03/15/2019 with hemoglobin 13.8, MCV 91, platelets 334K, CMP with AST 43, ALT 44, normal alk phos 77, albumin 4.4. Past Medical History:  Diagnosis Date  . Abnormal mammogram    2 DARK AREAS IN RIGHT BREAST TISSUE ON 08-2019 FOLLOW UP IN 6 MONTHS  . Anxiety   . Arthritis   . Asthma   . Chronic neck pain   . COVID-19 02/18/2019   SYMPTOMS X 2 WEEKS ACHED ALL OVER, FEVER, ALL SYMPTOMS RESOLVED  . Depression   . Elevated cholesterol   . Fatty liver   . Fibromyalgia   . Gallstone   . Ganglion cyst of wrist    RIGHT  . GERD (gastroesophageal reflux disease)   . Hiatal hernia   . History of angina    5 TO 6 YRS AGO, SAW CARDIOLOGY AND RELEASED, NO ANGINA 5 TO 6 YRS  . History of kidney stones    LEFT KIDNEY  . History of seizure 1992   X 1 WHILE PREGNANT NONE SINCE  . HTN (hypertension), benign   . Illiterate   . Migraine   . Other physeal fracture of right metatarsal, initial encounter for closed fracture    WEARS BOOT  . UTI (urinary tract infection)     Past Surgical History:  Procedure Laterality Date  . APPENDECTOMY  AGE 28  . CHOLECYSTECTOMY  2015  . COLONOSCOPY  06/01/2014   Mild diverticulosis. Otherwise normal colonsocopy.   . COLPOSCOPY  2015   OF CERVIX  . CRYOABLATION  1980   CERVIX  . Cyst removed  from shoulder Left YRS AGO  . ESOPHAGOGASTRODUODENOSCOPY  10/25/2015   Mild gastritis. Small hiatal hernia. Gastric polyps status post polypectomy x 2.   . HERNIA REPAIR  YRS AGO   [place]   . ORIF TOE FRACTURE Right 10/20/2019   Procedure: OPEN REDUCTION INTERNAL FIXATION (ORIF) METATARSAL (TOE) FRACTURE;  Surgeon: Evelina Bucy, DPM;  Location: [facility];  Service: Podiatry;  Laterality: Right;  Regional, LMA - Popliteal block  . URETHRA SURGERY  1980'S    Family History  Problem Relation Age of Onset  . Diabetes Mother   . Heart disease Mother   . Diabetes Sister   . Hypertension Sister   . Kidney cancer Father   . Colon cancer Neg Hx   .  Esophageal cancer Neg Hx     Social History   Tobacco Use  . Smoking status: Never Smoker  . Smokeless tobacco: Never Used  Vaping Use  . Vaping Use: Never used  Substance Use Topics  . Alcohol use: Not Currently  . Drug use: Never    Current Outpatient Medications  Medication Sig Dispense Refill  . amLODipine (NORVASC) 10 MG tablet Take 10 mg by mouth daily.    . Aspirin-Acetaminophen-Caffeine (EXCEDRIN MIGRAINE PO) Take 1 tablet by mouth daily as needed (Migraines).     . cyclobenzaprine (FLEXERIL) 10 MG tablet Take 10 mg by mouth 2 (two) times daily as needed.    . dicyclomine (BENTYL) 20 MG tablet Take 1 tablet (20 mg total) by mouth 2 (two) times daily as needed for spasms. 60 tablet 6  . ibuprofen (ADVIL) 800 MG tablet Take by mouth at bedtime.    Marland Kitchen lisinopril (PRINIVIL,ZESTRIL) 10 MG tablet Take 10 mg by mouth daily.     . ondansetron (ZOFRAN) 4 MG tablet Take 1 tablet (4 mg total) by mouth every 8 (eight) hours as needed for nausea or vomiting. 20 tablet 0  . pantoprazole (PROTONIX) 40 MG tablet Take 1 tablet (40 mg total) by mouth daily. 30 tablet 6  . PROAIR HFA 108 (90 Base) MCG/ACT inhaler Inhale 2 puffs into the lungs daily as needed for wheezing or shortness of breath.     . promethazine (PHENERGAN) 25 MG tablet Take 25 mg by mouth daily as needed (Migraines).      No current facility-administered medications for this visit.    Allergies  Allergen Reactions  . Sulfa Antibiotics Rash    TONGUE TURNED WHITE    Review of Systems:  Has anxiety or depression     Physical Exam:    Pulse 64   Ht _0  (1.702 m)   Wt 203 lb 2 oz (92.1 kg)   LMP 02/19/1999   BMI 31.81 kg/m  Filed Weights   05/26/20 1019  Weight: 203 lb 2 oz (92.1 kg)   Constitutional:  Well-developed, in no acute distress. Psychiatric: Normal mood and affect. Behavior is normal. HEENT: Pupils normal.  Conjunctivae are normal. No scleral icterus. Neck supple.  Cardiovascular: Normal rate,  regular rhythm. No edema Pulmonary/chest: Effort normal and breath sounds normal. No wheezing, rales or rhonchi. Abdominal: Soft, nondistended.  Right upper quad abdominal tenderness-reproducible.  Bowel sounds active throughout. There are no masses palpable. No hepatomegaly. Rectal:  defered Neurological: Alert and oriented to person place and time. Skin: Skin is warm and dry. No rashes noted.  Data Reviewed: I have personally reviewed following labs and imaging studies  CBC: CBC Latest Ref Rng & Units 10/20/2019 04/23/2018 12/30/2017  WBC 4.0 -  10.5 K/uL 8.3 6.9 5.9  Hemoglobin 12.0 - 15.0 g/dL 13.8 13.7 13.3  Hematocrit 36.0 - 46.0 % 42.2 41.4 40.3  Platelets 150 - 400 K/uL 335 326.0 [place], MD 05/26/2020, 10:31 AM  Cc: [facility], [facility]*

## 2020-05-26 NOTE — Patient Instructions (Signed)
If you are age 61 or older, your body mass index should be between 23-30. Your Body mass index is 31.81 kg/m. If this is out of the aforementioned range listed, please consider follow up with your Primary Care Provider.  If you are age 67 or younger, your body mass index should be between 19-25. Your Body mass index is 31.81 kg/m. If this is out of the aformentioned range listed, please consider follow up with your Primary Care Provider.   Please go to the lab on the 2nd floor [address] before you leave the office today.   You have been scheduled for an endoscopy and colonoscopy. Please follow the written instructions given to you at your visit today. Please pick up your prep supplies at the pharmacy within the next 1-3 days. If you use inhalers (even only as needed), please bring them with you on the day of your procedure.  We have sent the following medications to your pharmacy for you to pick up at your convenience: Protonix   Thank you,  Dr. Lynann Bologna

## 2020-05-30 LAB — CELIAC PANEL 10
Antigliadin Abs, IgA: 4 units (ref 0–19)
Endomysial IgA: NEGATIVE
Gliadin IgG: 4 units (ref 0–19)
IgA/Immunoglobulin A, Serum: 384 mg/dL — ABNORMAL HIGH (ref 87–352)
Tissue Transglut Ab: 2 U/mL (ref 0–5)
Transglutaminase IgA: <2 U/mL (ref 0–3)

## 2020-05-31 ENCOUNTER — Other Ambulatory Visit (INDEPENDENT_AMBULATORY_CARE_PROVIDER_SITE_OTHER): Payer: Medicaid Other

## 2020-05-31 DIAGNOSIS — R1011 Right upper quadrant pain: Secondary | ICD-10-CM

## 2020-05-31 DIAGNOSIS — K219 Gastro-esophageal reflux disease without esophagitis: Secondary | ICD-10-CM

## 2020-05-31 DIAGNOSIS — K581 Irritable bowel syndrome with constipation: Secondary | ICD-10-CM

## 2020-05-31 DIAGNOSIS — K449 Diaphragmatic hernia without obstruction or gangrene: Secondary | ICD-10-CM

## 2020-05-31 DIAGNOSIS — K760 Fatty (change of) liver, not elsewhere classified: Secondary | ICD-10-CM

## 2020-06-02 LAB — CALPROTECTIN, FECAL: Calprotectin, Fecal: 19 ug/g (ref 0–120)

## 2020-06-03 LAB — GI PROFILE, STOOL, PCR

## 2020-06-05 ENCOUNTER — Telehealth: Payer: Self-pay

## 2020-06-05 MED ORDER — VANCOMYCIN HCL 125 MG PO CAPS: 125.0000 mg | ORAL_CAPSULE | Freq: Four times a day (QID) | ORAL | 0 refills | Status: AC

## 2020-06-05 NOTE — Telephone Encounter (Signed)
Patient's phone rang busy. Letter sent as well and medication sent too

## 2020-06-05 NOTE — Telephone Encounter (Signed)
-----   Message from Lynann Bologna, MD sent at 06/05/2020  4:15 PM EDT ----- Very strange.  I just got the report back She is positive for C. Difficile  Plan: Vancomycin 125 mg p.o. 4 times daily x 10 days.  Start as soon as possible RG

## 2020-06-06 NOTE — Telephone Encounter (Signed)
Questions answered and patient still wants to know if she needs any cream

## 2020-06-06 NOTE — Telephone Encounter (Signed)
Patient call stating that she has been itching and tingling in her rectum. I told her that she is positive for C diff and needed to get her medication from the pharmacy and she wants to know if she needs something else

## 2020-06-06 NOTE — Telephone Encounter (Signed)
Patient called again after phone call. Wanting to know more information about C Diff. Best number for contact [phone] stated could try either number

## 2020-06-07 LAB — OVA AND PARASITE EXAMINATION
CONCENTRATE RESULT:: NONE SEEN
MICRO NUMBER:: 11765613
SPECIMEN QUALITY:: ADEQUATE
TRICHROME RESULT:: NONE SEEN

## 2020-06-08 MED ORDER — HYDROCORTISONE (PERIANAL) 2.5 % EX CREA: 1.0000 "application " | TOPICAL_CREAM | CUTANEOUS | 1 refills | Status: AC

## 2020-06-08 NOTE — Telephone Encounter (Signed)
Medication sent and tried to call patient and ranged busy

## 2020-06-08 NOTE — Telephone Encounter (Signed)
Please call in hydrocortisone 2.5% 1 twice daily PR x 7 days, 1 refill RG

## 2020-06-21 ENCOUNTER — Telehealth: Payer: Self-pay

## 2020-06-21 NOTE — Telephone Encounter (Signed)
Called patient's number and emergency contract. Left voicemail on the emergency contact that they needed to call us back regarding her procedure as soon as possible. Her instructions came back in the mail and she need these instructions.

## 2020-06-30 ENCOUNTER — Other Ambulatory Visit: Payer: Self-pay

## 2020-06-30 ENCOUNTER — Encounter: Payer: Self-pay | Admitting: Gastroenterology

## 2020-06-30 ENCOUNTER — Ambulatory Visit (AMBULATORY_SURGERY_CENTER): Payer: Medicaid Other | Admitting: Gastroenterology

## 2020-06-30 VITALS — BP 125/76 | HR 65 | Temp 98.4°F | Resp 14 | Ht 67.0 in | Wt 203.0 lb

## 2020-06-30 DIAGNOSIS — R1011 Right upper quadrant pain: Secondary | ICD-10-CM

## 2020-06-30 DIAGNOSIS — K319 Disease of stomach and duodenum, unspecified: Secondary | ICD-10-CM

## 2020-06-30 DIAGNOSIS — K581 Irritable bowel syndrome with constipation: Secondary | ICD-10-CM

## 2020-06-30 DIAGNOSIS — K2970 Gastritis, unspecified, without bleeding: Secondary | ICD-10-CM

## 2020-06-30 DIAGNOSIS — K648 Other hemorrhoids: Secondary | ICD-10-CM

## 2020-06-30 DIAGNOSIS — R1310 Dysphagia, unspecified: Secondary | ICD-10-CM

## 2020-06-30 DIAGNOSIS — K317 Polyp of stomach and duodenum: Secondary | ICD-10-CM

## 2020-06-30 DIAGNOSIS — K635 Polyp of colon: Secondary | ICD-10-CM

## 2020-06-30 DIAGNOSIS — K5730 Diverticulosis of large intestine without perforation or abscess without bleeding: Secondary | ICD-10-CM | POA: Diagnosis not present

## 2020-06-30 MED ORDER — SODIUM CHLORIDE 0.9 % IV SOLN: 500.0000 mL | Freq: Once | INTRAVENOUS | Status: DC

## 2020-06-30 NOTE — Op Note (Signed)
[facility] Patient Name: Hailey Moyer Procedure Date: 06/30/2020 1:56 PM MRN: [id number] Endoscopist: Lynann Bologna , MD Age: 60 Referring MD:  Date of Birth: January 02, 1961 Gender: Female Account #: [id number] Procedure:                Colonoscopy Indications:              Screening for colorectal malignant neoplasm. IBS-C Medicines:                Monitored Anesthesia Care Procedure:                Pre-Anesthesia Assessment:                           - Prior to the procedure, a History and Physical                            was performed, and patient medications and                            allergies were reviewed. The patient's tolerance of                            previous anesthesia was also reviewed. The risks                            and benefits of the procedure and the sedation                            options and risks were discussed with the patient.                            All questions were answered, and informed consent                            was obtained. Prior Anticoagulants: The patient has                            taken no previous anticoagulant or antiplatelet                            agents. ASA Grade Assessment: II - A patient with                            mild systemic disease. After reviewing the risks                            and benefits, the patient was deemed in                            satisfactory condition to undergo the procedure.                           After obtaining informed consent, the colonoscope  was passed under direct vision. Throughout the                            procedure, the patient's blood pressure, pulse, and                            oxygen saturations were monitored continuously. The                            Olympus PFC-[id number] (#[id number]) Colonoscope was                            introduced through the anus and advanced to the 2                            cm into the  ileum. The colonoscopy was performed                            without difficulty. The patient tolerated the                            procedure well. The quality of the bowel                            preparation was adequate to identify polyps. There                            was retained stool and preparation in some areas of                            the colon making it somewhat harder to visualize.                            Aggressive suctioning and aspiration was performed.                            Overall over 90-95% of colonic mucosa was                            visualized satisfactorily. Of note the small and                            flat lesions could have been missed. The terminal                            ileum, ileocecal valve, appendiceal orifice, and                            rectum were photographed. Scope In: 2:13:56 PM Scope Out: 2:29:27 PM Scope Withdrawal Time: 0 hours 8 minutes 59 seconds  Total Procedure Duration: 0 hours 15 minutes 31 seconds  Findings:                 A 10 mm polyp was found  in the proximal sigmoid                            colon. The polyp was sessile. The polyp was removed                            with a hot snare. Resection and retrieval were                            complete.                           A few rare small-mouthed diverticula were found in                            the sigmoid colon.                           Non-bleeding internal hemorrhoids were found during                            retroflexion. The hemorrhoids were small.                           The terminal ileum appeared normal.                           The exam was otherwise without abnormality on                            direct and retroflexion views. Highly redundant                            colon. Complications:            No immediate complications. Estimated Blood Loss:     Estimated blood loss: none. Impression:               - One 10 mm  polyp in the proximal sigmoid colon,                            removed with a hot snare. Resected and retrieved.                           - Very minimal sigmoid diverticulosis.                           - Non-bleeding internal hemorrhoids.                           - The examined portion of the ileum was normal.                           - The examination was otherwise normal on direct  and retroflexion views. Highly redundant colon. Recommendation:           - Patient has a contact number available for                            emergencies. The signs and symptoms of potential                            delayed complications were discussed with the                            patient. Return to normal activities tomorrow.                            Written discharge instructions were provided to the                            patient.                           - Resume previous diet.                           - Continue present medications.                           - No aspirin, ibuprofen, naproxen, or other                            non-steroidal anti-inflammatory drugs after polyp                            removal.                           - Await pathology results.                           - Repeat colonoscopy for surveillance based on                            pathology results.                           - Miralax 1 capful (17 grams) in 8 ounces of water                            PO daily.                           - The findings and recommendations were discussed                            with the patient's family. Lynann Bologna, MD 06/30/2020 2:38:56 PM This report has been signed electronically.

## 2020-06-30 NOTE — Op Note (Signed)
[facility] Patient Name: Hailey Moyer Procedure Date: 06/30/2020 1:57 PM MRN: [id number] Endoscopist: Lynann Bologna , MD Age: 60 Referring MD:  Date of Birth: 1960-11-06 Gender: Female Account #: [id number] Procedure:                Upper GI endoscopy Indications:              Dysphagia, RUQ pain. Medicines:                Monitored Anesthesia Care Procedure:                Pre-Anesthesia Assessment:                           - Prior to the procedure, a History and Physical                            was performed, and patient medications and                            allergies were reviewed. The patient's tolerance of                            previous anesthesia was also reviewed. The risks                            and benefits of the procedure and the sedation                            options and risks were discussed with the patient.                            All questions were answered, and informed consent                            was obtained. Prior Anticoagulants: The patient has                            taken no previous anticoagulant or antiplatelet                            agents. ASA Grade Assessment: II - A patient with                            mild systemic disease. After reviewing the risks                            and benefits, the patient was deemed in                            satisfactory condition to undergo the procedure.                           After obtaining informed consent, the endoscope was  passed under direct vision. Throughout the                            procedure, the patient's blood pressure, pulse, and                            oxygen saturations were monitored continuously. The                            Endoscope was introduced through the mouth, and                            advanced to the second part of duodenum. The upper                            GI endoscopy was accomplished  without difficulty.                            The patient tolerated the procedure well. Scope In: Scope Out: Findings:                 The examined esophagus was normal with well-defined                            Z-line at 36 cm, examined by NBI. Biopsies were                            obtained from the proximal and distal esophagus                            with cold forceps for histology of suspected                            eosinophilic esophagitis. The scope was withdrawn.                            Dilation was performed with a Maloney dilator with                            mild resistance at 50 Fr and 52 Fr.                           Localized mild inflammation characterized by                            erythema was found in the gastric antrum. Biopsies                            were taken with a cold forceps for histology.                           Multiple (10-12)6 to 8 mm sessile polyps with no  bleeding and no stigmata of recent bleeding were                            found in the gastric fundus and in the gastric                            body. 4 polyps were removed with a hot snare.                            Resection and retrieval were complete.                           The examined duodenum was normal. Biopsies for                            histology were taken with a cold forceps for                            evaluation of celiac disease. Complications:            No immediate complications. Estimated Blood Loss:     Estimated blood loss: none. Impression:               - Mild gastritis. Biopsied.                           - Multiple gastric polyps. (Resected and retrieved                            x 4)                           - S/P empiric esophageal dilatation. Recommendation:           - Patient has a contact number available for                            emergencies. The signs and symptoms of potential                             delayed complications were discussed with the                            patient. Return to normal activities tomorrow.                            Written discharge instructions were provided to the                            patient.                           - Post dilatation diet.                           - Continue present medications.                           -  Await pathology results.                           - The findings and recommendations were discussed                            with the patient's family.                           - Return to GI clinic PRN. If still with problems,                            will perform further work-up. Lynann Bologna, MD 06/30/2020 2:35:07 PM This report has been signed electronically.

## 2020-06-30 NOTE — Progress Notes (Signed)
Called to room to assist during endoscopic procedure.  Patient ID and intended procedure confirmed with present staff. Received instructions for my participation in the procedure from the performing physician.  

## 2020-06-30 NOTE — Progress Notes (Signed)
Vital signs checked by:Malin  The medical and surgical history was reviewed and verified with the patient. 

## 2020-06-30 NOTE — Progress Notes (Signed)
Report to PACU, RN, vss, BBS= Clear.  

## 2020-06-30 NOTE — Patient Instructions (Signed)
YOU HAD AN ENDOSCOPIC PROCEDURE TODAY AT THE [facility]:   Refer to the procedure report that was given to you for any specific questions about what was found during the examination.  If the procedure report does not answer your questions, please call your gastroenterologist to clarify.  If you requested that your care partner not be given the details of your procedure findings, then the procedure report has been included in a sealed envelope for you to review at your convenience later.  YOU SHOULD EXPECT: Some feelings of bloating in the abdomen. Passage of more gas than usual.  Walking can help get rid of the air that was put into your GI tract during the procedure and reduce the bloating. If you had a lower endoscopy (such as a colonoscopy or flexible sigmoidoscopy) you may notice spotting of blood in your stool or on the toilet paper. If you underwent a bowel prep for your procedure, you may not have a normal bowel movement for a few days.  Please Note:  You might notice some irritation and congestion in your nose or some drainage.  This is from the oxygen used during your procedure.  There is no need for concern and it should clear up in a day or so.  SYMPTOMS TO REPORT IMMEDIATELY:   Following lower endoscopy (colonoscopy or flexible sigmoidoscopy):  Excessive amounts of blood in the stool  Significant tenderness or worsening of abdominal pains  Swelling of the abdomen that is new, acute  Fever of 100F or higher   Following upper endoscopy (EGD)  Vomiting of blood or coffee ground material  New chest pain or pain under the shoulder blades  Painful or persistently difficult swallowing  New shortness of breath  Fever of 100F or higher  Black, tarry-looking stools  For urgent or emergent issues, a gastroenterologist can be reached at any hour by calling [phone]. Do not use MyChart messaging for urgent concerns.    DIET:  We do recommend a small meal at first, but  then you may proceed to your regular diet.  Drink plenty of fluids but you should avoid alcoholic beverages for 24 hours.  ACTIVITY:  You should plan to take it easy for the rest of today and you should NOT DRIVE or use heavy machinery until tomorrow (because of the sedation medicines used during the test).    FOLLOW UP: Our staff will call the number listed on your records 48-72 hours following your procedure to check on you and address any questions or concerns that you may have regarding the information given to you following your procedure. If we do not reach you, we will leave a message.  We will attempt to reach you two times.  During this call, we will ask if you have developed any symptoms of COVID 19. If you develop any symptoms (ie: fever, flu-like symptoms, shortness of breath, cough etc.) before then, please call [phone].  If you test positive for Covid 19 in the 2 weeks post procedure, please call and report this information to us.    If any biopsies were taken you will be contacted by phone or by letter within the next 1-3 weeks.  Please call us at [phone] if you have not heard about the biopsies in 3 weeks.    SIGNATURES/CONFIDENTIALITY: You and/or your care partner have signed paperwork which will be entered into your electronic medical record.  These signatures attest to the fact that that the information above on   your After Visit Summary has been reviewed and is understood.  Full responsibility of the confidentiality of this discharge information lies with you and/or your care-partner. 

## 2020-07-03 ENCOUNTER — Other Ambulatory Visit: Payer: Self-pay | Admitting: Gastroenterology

## 2020-07-03 DIAGNOSIS — K219 Gastro-esophageal reflux disease without esophagitis: Secondary | ICD-10-CM

## 2020-07-03 DIAGNOSIS — R1011 Right upper quadrant pain: Secondary | ICD-10-CM

## 2020-07-04 ENCOUNTER — Telehealth: Payer: Self-pay | Admitting: *Deleted

## 2020-07-04 NOTE — Telephone Encounter (Signed)
  Follow up Call-  Call back number 06/30/2020  Post procedure Call Back phone  # [phone]  Permission to leave phone message No  comments vm not set up  Some recent data might be hidden     Patient questions:  Do you have a fever, pain , or abdominal swelling? No. Pain Score  0 *  Have you tolerated food without any problems? Yes.    Have you been able to return to your normal activities? Yes.    Do you have any questions about your discharge instructions: Diet   No. Medications  No. Follow up visit  No.  Do you have questions or concerns about your Care? No.- pt states she is not having any pain, but she is still having some bloating from her procedure. RN suggested pt try taking GasX to help with the bloating. RN asked pt if she had started the daily Miralax that the MD recommended at the time of her procedure. She stated she had not started it yet but planned to today and also planned to take GasX today. RN instructed pt to call back if bloating does not resolve or if she begins to feel worse. Pt agreeable to plan of care.   Actions: * If pain score is 4 or above: No action needed, pain <4.  1. Have you developed a fever since your procedure? no  2.   Have you had an respiratory symptoms (SOB or cough) since your procedure? no  3.   Have you tested positive for COVID 19 since your procedure no  4.   Have you had any family members/close contacts diagnosed with the COVID 19 since your procedure?  no   If yes to any of these questions please route to Laverna Peace, RN and Karlton Lemon, RN

## 2020-07-10 ENCOUNTER — Telehealth: Payer: Self-pay | Admitting: Gastroenterology

## 2020-07-10 NOTE — Telephone Encounter (Signed)
Dr. Chales Abrahams pt is calling for colon results, please advise.

## 2020-07-12 ENCOUNTER — Encounter: Payer: Self-pay | Admitting: Gastroenterology

## 2020-08-02 ENCOUNTER — Telehealth: Payer: Self-pay | Admitting: Gastroenterology

## 2020-08-02 DIAGNOSIS — R1011 Right upper quadrant pain: Secondary | ICD-10-CM

## 2020-08-02 NOTE — Telephone Encounter (Signed)
Pt states when she eats or drinks anything her stomach is very sore and swollen. She states last week for 4 days her stools were black, she does state she took pepto bismol. She thinks she is having an issue with an ulcer, states she has had one in the past. Please advise.

## 2020-08-02 NOTE — Telephone Encounter (Signed)
Sore/swollen stomach pain for about 2 weeks. Believes is ulcer, running to restroom when eating, black stool for 3-4 days then went back to normal. Asking if he could possible prescribe anything to help. Best contact number [phone]

## 2020-08-03 NOTE — Telephone Encounter (Signed)
She had EGD last month which did not show any ulcers. Plan: -Check CBC, CMP ASAP -Hemoccult stools x 3. -She needs to stop taking any nonsteroidals. -Make sure that she is taking PPIs.  RG

## 2020-08-04 NOTE — Telephone Encounter (Signed)
Spoke with pt and she is aware. Orders in for labs. Pt states she will come when she has transportation.

## 2020-09-04 ENCOUNTER — Ambulatory Visit (INDEPENDENT_AMBULATORY_CARE_PROVIDER_SITE_OTHER): Payer: Medicaid Other | Admitting: Podiatry

## 2020-09-04 DIAGNOSIS — G5763 Lesion of plantar nerve, bilateral lower limbs: Secondary | ICD-10-CM

## 2020-09-04 MED ORDER — DEXAMETHASONE SODIUM PHOSPHATE 120 MG/30ML IJ SOLN
8.0000 mg | Freq: Once | INTRAMUSCULAR | Status: AC
  Administered 2020-09-04: 8 mg via INTRA_ARTICULAR

## 2020-09-04 NOTE — Progress Notes (Signed)
  Subjective:  Patient ID: Hailey Moyer, female    DOB: 20-Aug-1960,  MRN: [id number]  No chief complaint on file.  60 y.o. female presents for f/u of bilateral foot pain. States the areas she previously had injections at have gotten more painful and she requests repeat injection today.  Objective:  Physical Exam: Tenderness to palpation about the 3rd interspace bilat with Mulder's click. Well healed inision from prior surgery. No pain at R 5th metatarsal.  Assessment:   1. Morton's neuroma of both feet    Plan:  Patient was evaluated and treated and all questions answered.  Post-operative State -Well healed  Morton Neuroma bilat -Injection delivered to the 3rd interspace bilat  Procedure: Neuroma Injection Location: Bilateral 3rd interspace Skin Prep: Alcohol. Injectate: 0.5 cc 0.5% marcaine plain, 0.5 cc betamethasone acetate-betamethasone sodium phosphate Disposition: Patient tolerated procedure well. Injection site dressed with a band-aid.   No follow-ups on file.

## 2021-01-16 ENCOUNTER — Other Ambulatory Visit: Payer: Self-pay | Admitting: Gastroenterology

## 2021-01-16 DIAGNOSIS — K219 Gastro-esophageal reflux disease without esophagitis: Secondary | ICD-10-CM

## 2021-01-16 DIAGNOSIS — R1011 Right upper quadrant pain: Secondary | ICD-10-CM

## 2021-05-16 ENCOUNTER — Other Ambulatory Visit: Payer: Self-pay | Admitting: Gastroenterology

## 2021-05-16 DIAGNOSIS — R1011 Right upper quadrant pain: Secondary | ICD-10-CM

## 2021-06-29 IMAGING — RF DG FOOT 2V*R*
1 series · 2 of 2 positions shown · non-contrast
Comparison: Radiograph 10/07/2019

CLINICAL DATA: ORIF, right metatarsal

EXAM:
RIGHT FOOT - 2 VIEW

[Series 1: unknown protocol · 0.14mm/px · 2 of 2 slices shown]
[im 1/2]
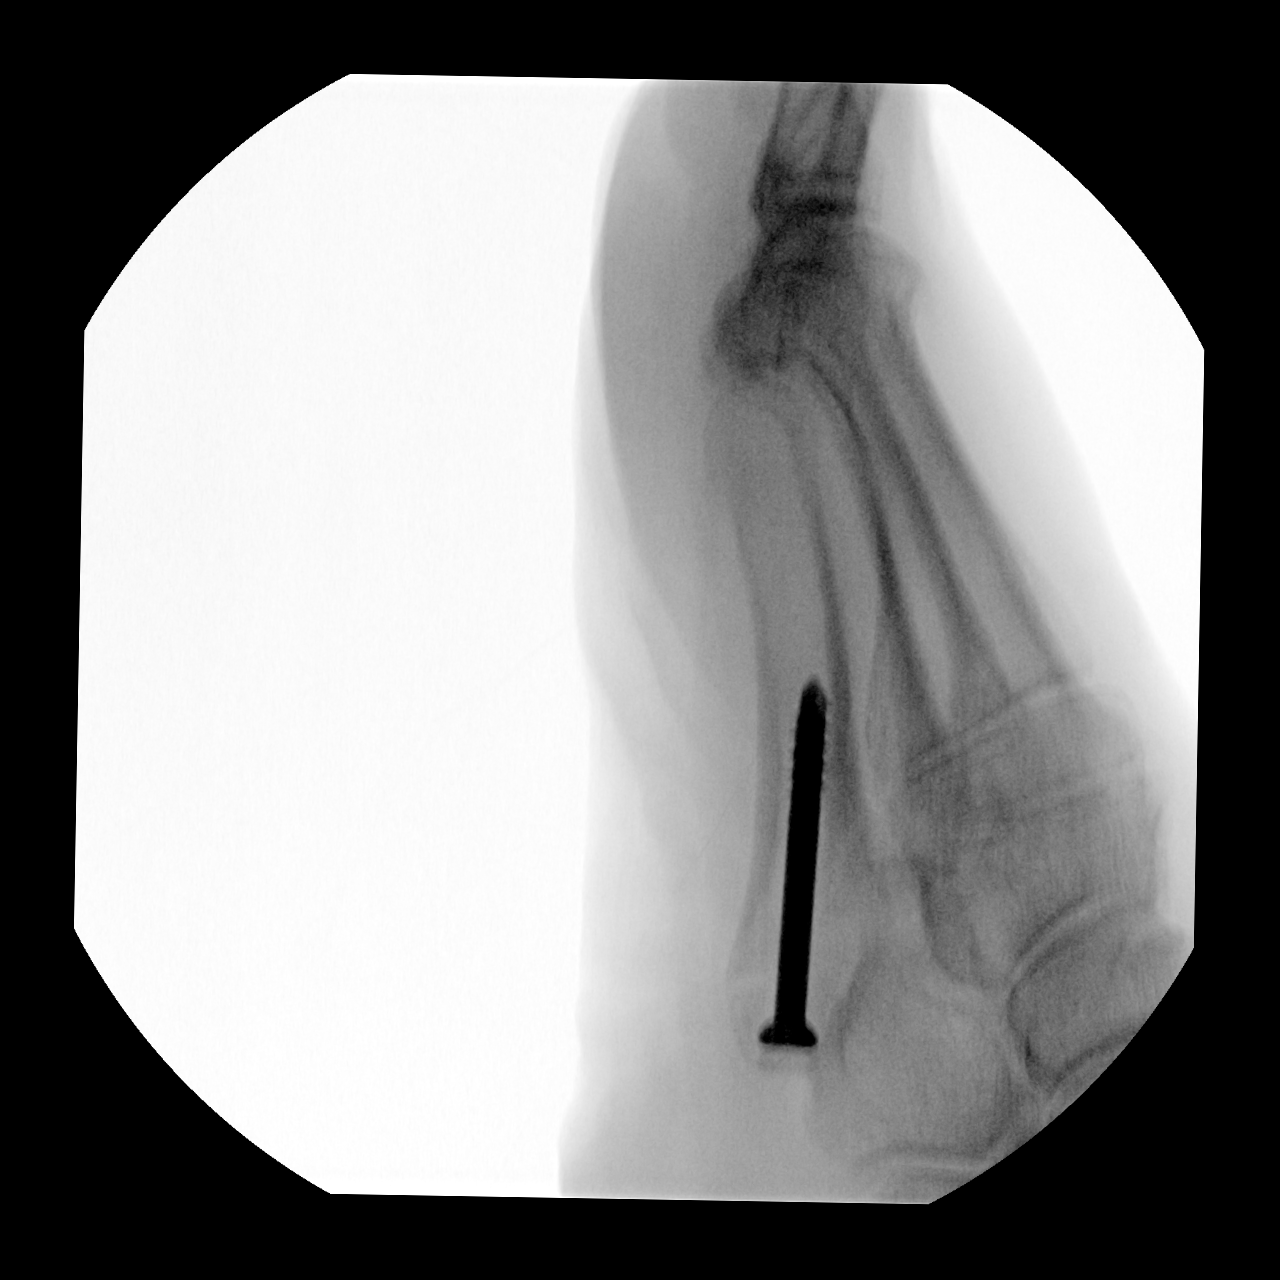
[im 2/2]
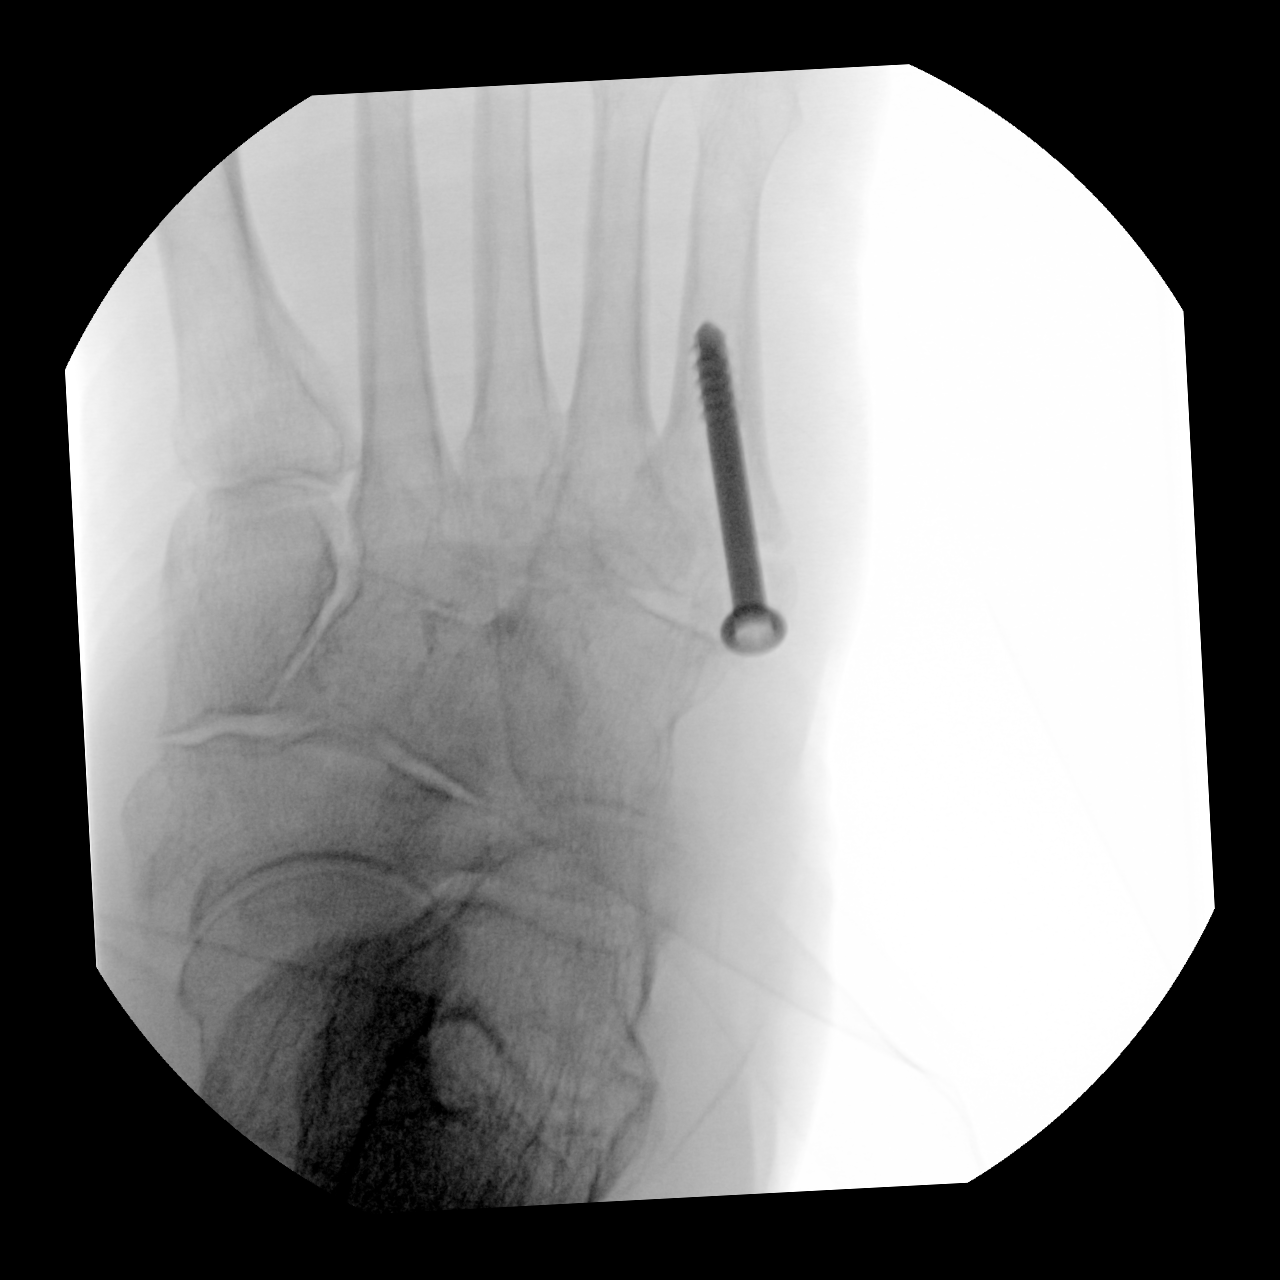

[2 of 2 positions shown; findings below may reference images not displayed]

FINDINGS: Intraoperative fluoroscopic images demonstrate a longitudinal
surgical screw placement transfixing a minimally displaced fracture
at the base of the fifth metatarsal with near anatomic alignment
post fixation. No acute complication is seen. No additional
fractures or acute osseous injuries are identified. Minimal
degenerative changes again seen in the hindfoot.
IMPRESSION: ORIF of a minimally displaced fracture at the base of the fifth
metatarsal. No acute complication.

## 2021-11-15 ENCOUNTER — Other Ambulatory Visit: Payer: Self-pay | Admitting: Gastroenterology

## 2021-11-15 DIAGNOSIS — R1011 Right upper quadrant pain: Secondary | ICD-10-CM

## 2021-11-15 DIAGNOSIS — K219 Gastro-esophageal reflux disease without esophagitis: Secondary | ICD-10-CM

## 2021-11-19 ENCOUNTER — Other Ambulatory Visit: Payer: Self-pay | Admitting: Gastroenterology

## 2021-11-19 DIAGNOSIS — R1011 Right upper quadrant pain: Secondary | ICD-10-CM

## 2021-11-19 DIAGNOSIS — K219 Gastro-esophageal reflux disease without esophagitis: Secondary | ICD-10-CM

## 2022-05-19 ENCOUNTER — Other Ambulatory Visit: Payer: Self-pay | Admitting: Gastroenterology

## 2022-05-19 DIAGNOSIS — R1011 Right upper quadrant pain: Secondary | ICD-10-CM

## 2022-05-19 DIAGNOSIS — K219 Gastro-esophageal reflux disease without esophagitis: Secondary | ICD-10-CM

## 2023-09-24 ENCOUNTER — Encounter: Payer: Self-pay | Admitting: Gastroenterology

## 2023-09-24 ENCOUNTER — Ambulatory Visit: Admitting: Gastroenterology

## 2023-09-24 VITALS — BP 122/68 | HR 66 | Ht 67.0 in | Wt 195.0 lb

## 2023-09-24 DIAGNOSIS — K760 Fatty (change of) liver, not elsewhere classified: Secondary | ICD-10-CM

## 2023-09-24 DIAGNOSIS — R1011 Right upper quadrant pain: Secondary | ICD-10-CM

## 2023-09-24 DIAGNOSIS — Z860100 Personal history of colon polyps, unspecified: Secondary | ICD-10-CM | POA: Diagnosis not present

## 2023-09-24 MED ORDER — NA SULFATE-K SULFATE-MG SULF 17.5-3.13-1.6 GM/177ML PO SOLN: 1.0000 | Freq: Once | ORAL | 0 refills | Status: AC

## 2023-09-24 NOTE — Progress Notes (Signed)
 Chief Complaint: chronic RUQ abdominal pain Primary GI Doctor:Dr. Charlanne  HPI:  Patient is a  63  year old female patient with past medical history of FB, GERD,DM, hypertension, migraines, who presents for evaluation of chronic RUQ abdominal pain Patient last seen in GI office by Dr. Charlanne on 05/26/2020.  GI history: Chronic (7-8 years) RUQ pain -likely musculoskeletal, could be related to back pain/fibromyalgia.  Has reproducible muscle tenderness (pos Carnett's). Neg CT abdo/pel 02/2019, 2019 neg except for fatty liver, neg US  03/23/2018, 12/2016, neg EGD 10/2015, UGI series, HIDA with EF but had symptoms, s/p cholecystectomy without pain relief.   History of IBS with constipation.  Neg colon 05/2014   GERD with small HH on EGD 10/2015. Neg SB Bx   Fatty liver  Has been to multiple gastroenterologists.    Interval History  Patient presents for evaluation of chronic RUQ abdominal pain. Patient states pain worse with standing and being on feet on long periods of time. Pain radiates to her back. Patient reports eating makes her abdomen swell and the pain becomes worse. No particular foods. The pain is reproducible and after her last physical with PCP she states the pain persistent for a day or two just from pushing on her stomach. Patient is taking gabapentin prn for pain in hands and feet. She reports this does not help with abdominal pain. She has been prescribed bentyl  in the past by Dr. Charlanne with no improvement.   Patient has history of  GERD and currently taking pantoprazole  40mg  po daily. She reports this controls her GERD. Patient denies dysphagia. Patient denies nausea, vomiting, or weight loss.  Former drinker, stopped. Nonsmoker.  History of seizure, one episode with pregnancy 30 years ago.  Surgical history: hernia repair x 2, cyst removed back, cholecystectomy, cervix biopsy  GI procedures: EGD/colonoscopy 06/2020 EGD - Mild gastritis. Biopsied. - Multiple gastric polyps. (  Resected and retrieved x 4) - S/ P empiric esophageal dilatation. Path: 1. Surgical [P], duodenum - BENIGN SMALL BOWEL MUCOSA. - NO VILLOUS BLUNTING OR INCREASE IN INTRAEPITHELIAL LYMPHOCYTES. - NO DYSPLASIA OR MALIGNANCY. 2. Surgical [P], gastric - REACTIVE GASTROPATHY. [place] PHLEGM IS NEGATIVE FOR HELICOBACTER PYLORI. - NO INTESTINAL METAPLASIA, DYSPLASIA, OR MALIGNANCY. 3. Surgical [P], esophagus - BENIGN SQUAMOUS MUCOSA. - NO INCREASE IN INTRAEPITHELIAL EOSINOPHILS. - NO INTESTINAL METAPLASIA, DYSPLASIA, OR MALIGNANCY. 4. Surgical [P], gastric polyps, polyp (4) - FUNDIC GLAND POLYP(S). - NO INTESTINAL METAPLASIA, DYSPLASIA, OR MALIGNANCY. Colonoscopy, recall 3 years - One 10 mm polyp in the proximal sigmoid colon, removed with a hot snare. Resected and retrieved. - Very minimal sigmoid diverticulosis. - Non- bleeding internal hemorrhoids.- The examined portion of the ileum was normal. - The examination was otherwise normal on direct and retroflexion views. Highly redundant colon. Path: 5. Surgical [P], colon, sigmoid, polyp (1) - HYPERPLASTIC POLYP. - NO DYSPLASIA OR MALIGNANCY.  Wt Readings from Last 3 Encounters:  09/24/23 195 lb (88.5 kg)  06/30/20 203 lb (92.1 kg)  05/26/20 203 lb 2 oz (92.1 kg)      Past Medical History:  Diagnosis Date   Abnormal mammogram    2 DARK AREAS IN RIGHT BREAST TISSUE ON 08-2019 FOLLOW UP IN 6 MONTHS   Anxiety    Arthritis    Asthma    Chronic neck pain    COVID-19 02/18/2019   SYMPTOMS X 2 WEEKS ACHED ALL OVER, FEVER, ALL SYMPTOMS RESOLVED   Depression    Diabetes (HCC)    Elevated cholesterol  Fatty liver    Fibromyalgia    Gallstone    Ganglion cyst of wrist    RIGHT   GERD (gastroesophageal reflux disease)    Hiatal hernia    History of angina    5 TO 6 YRS AGO, SAW CARDIOLOGY AND RELEASED, NO ANGINA 5 TO 6 YRS   History of kidney stones    LEFT KIDNEY   History of seizure 1992   X 1 WHILE PREGNANT NONE SINCE   HTN  (hypertension), benign    Illiterate    Migraine    Other physeal fracture of right metatarsal, initial encounter for closed fracture    WEARS BOOT   UTI (urinary tract infection)     Past Surgical History:  Procedure Laterality Date   APPENDECTOMY  AGE 51   CHOLECYSTECTOMY  2015   COLONOSCOPY  06/01/2014   Mild diverticulosis. Otherwise normal colonsocopy.    COLPOSCOPY  2015   OF CERVIX   CRYOABLATION  1980   CERVIX   Cyst removed from shoulder Left YRS AGO   ESOPHAGOGASTRODUODENOSCOPY  10/25/2015   Mild gastritis. Small hiatal hernia. Gastric polyps status post polypectomy x 2.    HERNIA REPAIR  YRS AGO   [employer]    ORIF TOE FRACTURE Right 10/20/2019   Procedure: OPEN REDUCTION INTERNAL FIXATION (ORIF) METATARSAL (TOE) FRACTURE;  Surgeon: Gretel Ozell PARAS, DPM;  Location: [facility];  Service: Podiatry;  Laterality: Right;  Regional, LMA - Popliteal block   URETHRA SURGERY  1980'S    Current Outpatient Medications  Medication Sig Dispense Refill   amLODipine (NORVASC) 10 MG tablet Take 10 mg by mouth daily.     ammonium lactate (LAC-HYDRIN) 12 % lotion Apply topically 2 (two) times daily as needed.     Aspirin-Acetaminophen -Caffeine (EXCEDRIN MIGRAINE PO) Take 1 tablet by mouth daily as needed (Migraines).      atorvastatin (LIPITOR) 10 MG tablet Take 1 tablet by mouth at bedtime.     clobetasol (TEMOVATE) 0.05 % external solution Apply 1 Application topically.     divalproex (DEPAKOTE) 500 MG DR tablet Take 1,000 mg by mouth.     gabapentin (NEURONTIN) 300 MG capsule Take 300 mg by mouth at bedtime.     lisinopril (PRINIVIL,ZESTRIL) 10 MG tablet Take 10 mg by mouth daily.      metFORMIN (GLUCOPHAGE-XR) 500 MG 24 hr tablet TAKE 1 TABLET(500 MG) BY MOUTH EVERY NIGHT     Na Sulfate-K Sulfate-Mg Sulfate concentrate (SUPREP) 17.5-3.13-1.6 GM/177ML SOLN Take 1 kit (354 mLs total) by mouth once for 1 dose. 354 mL 0   pantoprazole  (PROTONIX ) 40 MG tablet Take 1 tablet (40 mg total) by  mouth daily. Call [phone] to schedule an office visit for more refills. 30 tablet 1   PARoxetine (PAXIL) 10 MG tablet Take 10 mg by mouth daily.     PROAIR HFA 108 (90 Base) MCG/ACT inhaler Inhale 2 puffs into the lungs daily as needed for wheezing or shortness of breath.      promethazine (PHENERGAN) 25 MG tablet Take 25 mg by mouth daily as needed (Migraines).      cyclobenzaprine (FLEXERIL) 10 MG tablet Take 10 mg by mouth 2 (two) times daily as needed.     dicyclomine  (BENTYL ) 20 MG tablet TAKE 1 TABLET(20 MG) BY MOUTH TWICE DAILY AS NEEDED FOR SPASMS 60 tablet 1   hydrocortisone  (ANUSOL -HC) 2.5 % rectal cream Place 1 application rectally as directed. 1 application 2 times daily for 7 days 30 g 1  ibuprofen (ADVIL) 800 MG tablet Take by mouth at bedtime.     ondansetron  (ZOFRAN ) 4 MG tablet Take 1 tablet (4 mg total) by mouth every 8 (eight) hours as needed for nausea or vomiting. 20 tablet 0   No current facility-administered medications for this visit.    Allergies as of 09/24/2023 - Review Complete 09/24/2023  Allergen Reaction Noted   Sulfa antibiotics Rash 05/04/2019    Family History  Problem Relation Age of Onset   Diabetes Mother    Heart disease Mother    Diabetes Sister    Hypertension Sister    Kidney cancer Father    Colon cancer Neg Hx    Esophageal cancer Neg Hx    Stomach cancer Neg Hx    Rectal cancer Neg Hx     Review of Systems:    Constitutional: No weight loss, fever, chills, weakness or fatigue HEENT: Eyes: No change in vision               Ears, Nose, Throat:  No change in hearing or congestion Skin: No rash or itching Cardiovascular: No chest pain, chest pressure or palpitations   Respiratory: No SOB or cough Gastrointestinal: See HPI and otherwise negative Genitourinary: No dysuria or change in urinary frequency Neurological: No headache, dizziness or syncope Musculoskeletal: No new muscle or joint pain Hematologic: No bleeding or  bruising Psychiatric: No history of depression or anxiety    Physical Exam:  Vital signs: BP 122/68   Pulse 66   Ht 5' 7 (1.702 m)   Wt 195 lb (88.5 kg)   LMP 02/19/1999   BMI 30.54 kg/m   Constitutional:   Pleasant  female appears to be in NAD, Well developed, Well nourished, alert and cooperative Eyes:   PEERL, EOMI. No icterus. Conjunctiva pink. Neck:  Supple Throat: Oral cavity and pharynx without inflammation, swelling or lesion.  Respiratory: Respirations even and unlabored. Lungs clear to auscultation bilaterally.   No wheezes, crackles, or rhonchi.  Cardiovascular: Normal S1, S2. Regular rate and rhythm. No peripheral edema, cyanosis or pallor.  Gastrointestinal:  Soft, nondistended, reproducible muscle tenderness (pos Carnett's). No rebound or guarding. Normal bowel sounds. No appreciable masses or hepatomegaly. Rectal:  Not performed.  Msk:  Symmetrical without gross deformities. Without edema, no deformity or joint abnormality.  Neurologic:  Alert and  oriented x4;  grossly normal neurologically.  Skin:   Dry and intact without significant lesions or rashes.  RELEVANT LABS AND IMAGING: CBC    Latest Ref Rng & Units 05/26/2020   11:27 AM 10/20/2019   12:00 PM 04/23/2018   11:10 AM  CBC  WBC 4.0 - 10.5 K/uL 8.1  8.3  6.9   Hemoglobin 12.0 - 15.0 g/dL 86.1  86.1  86.2   Hematocrit 36.0 - 46.0 % 41.7  42.2  41.4   Platelets 150.0 - 400.0 K/uL 340.0  335  326.0      CMP     Latest Ref Rng & Units 05/26/2020   11:27 AM 10/20/2019   12:00 PM 04/23/2018   11:10 AM  CMP  Glucose 70 - 99 mg/dL 68  886  89   BUN 6 - 23 mg/dL 7  13  13    Creatinine 0.40 - 1.20 mg/dL 9.29  9.17  9.26   Sodium 135 - 145 mEq/L 140  138  140   Potassium 3.5 - 5.1 mEq/L 3.6  3.6  3.9   Chloride 96 - 112 mEq/L 102  102  105   CO2 19 - 32 mEq/L 30  23  27    Calcium 8.4 - 10.5 mg/dL 9.9  9.5  9.7   Total Protein 6.0 - 8.3 g/dL 7.0   7.2   Total Bilirubin 0.2 - 1.2 mg/dL 0.5   0.6   Alkaline Phos  39 - 117 U/L 94   84   AST 0 - 37 U/L 33   28   ALT 0 - 35 U/L 43   37      Lab Results  Component Value Date   TSH 3.01 05/26/2020   08/21/23 labs show: LFT's normal, BUN 13, creatinine 0.78 09/17/2023 labs show WBC 8.1, hemoglobin 13.2, platelets 291  Assessment: Encounter Diagnoses  Name Primary?   RUQ abdominal pain Yes   History of colonic polyps    Fatty liver      63 year old female patient with chronic right upper quadrant abdominal pain that started prior to laparoscopic cholecystectomy years ago.  Patient has had negative imaging in the past. 12/2016, neg EGD 10/2015, UGI series, HIDA with EF but had symptoms, s/p cholecystectomy without pain relief.   Not relieved with PPI therapy. Not relieved with antispasmodics or gabapentin. Symptoms seem to be worse with position indicating I suspect more MSK etiology.  Recommended she try some Salonpas patches.  Will discuss with Dr. 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    Patient also with history of fatty liver disease, no recent imaging on file will order abdominal ultrasound to evaluate.  Recent lab work reveals normal LFTs and platelet level.  No alcohol use.    Patient also with history of colonic polyps and due for colon screening colonoscopy with 2-day prep.  Will schedule in the [facility] with Dr. Charlanne.  Plan: - Order Abd ultrasound complete  - OTC salonpas patch prn RUQ MSK pain -discuss with Dr. Charlanne is he thinks she is good candidate for injections by Dr. San for pain wall syndrome. -Schedule for a colonoscopy with 2 day prep in [facility] with Dr. Charlanne. The risks and benefits of colonoscopy with possible polypectomy / biopsies were discussed and the patient agrees to proceed.   Thank you for the courtesy of this consult. Please call me with any questions or concerns.   Hailey Millon, FNP-C [facility] 09/24/2023, 4:05 PM  Cc: [facility]*

## 2023-09-24 NOTE — Patient Instructions (Signed)
 RUQ abdominal pain  Over the counter Salonpas lidocaine  patch for MSK pain  We have sent the following medications to your pharmacy for you to pick up at your convenience: SUPREP  You have been scheduled for an abdominal ultrasound at [facility] on 09/29/23 at 9:30am. Please arrive 30 minutes prior to your appointment for registration. Make certain not to have anything to eat or drink after midnight prior to your appointment. Should you need to reschedule your appointment, please contact radiology at [phone]. This test typically takes about 30 minutes to perform.   You have been scheduled for a colonoscopy. Please follow written instructions given to you at your visit today.   If you use inhalers (even only as needed), please bring them with you on the day of your procedure.  DO NOT TAKE 7 DAYS PRIOR TO TEST- Trulicity (dulaglutide) Ozempic, Wegovy (semaglutide) Mounjaro (tirzepatide) Bydureon Bcise (exanatide extended release)  DO NOT TAKE 1 DAY PRIOR TO YOUR TEST Rybelsus (semaglutide) Adlyxin (lixisenatide) Victoza (liraglutide) Byetta (exanatide) ___________________________________________________________________________  Due to recent changes in healthcare laws, you may see the results of your imaging and laboratory studies on MyChart before your provider has had a chance to review them.  We understand that in some cases there may be results that are confusing or concerning to you. Not all laboratory results come back in the same time frame and the provider may be waiting for multiple results in order to interpret others.  Please give us  48 hours in order for your provider to thoroughly review all the results before contacting the office for clarification of your results.   _______________________________________________________  If your blood pressure at your visit was 140/90 or greater, please contact your primary care physician to follow up on  this.  _______________________________________________________  If you are age 64 or older, your body mass index should be between 23-30. Your Body mass index is 30.54 kg/m. If this is out of the aforementioned range listed, please consider follow up with your Primary Care Provider.  If you are age 33 or younger, your body mass index should be between 19-25. Your Body mass index is 30.54 kg/m. If this is out of the aformentioned range listed, please consider follow up with your Primary Care Provider.   ________________________________________________________  The [facility] providers would like to encourage you to use MYCHART to communicate with providers for non-urgent requests or questions.  Due to long hold times on the telephone, sending your provider a message by [facility] may be a faster and more efficient way to get a response.  Please allow 48 business hours for a response.  Please remember that this is for non-urgent requests.  _______________________________________________________  [facility] is using a team-based approach to care.  Your team is made up of your doctor and two to three APPS. Our APPS (Nurse Practitioners and Physician Assistants) work with your physician to ensure care continuity for you. They are fully qualified to address your health concerns and develop a treatment plan. They communicate directly with your gastroenterologist to care for you. Seeing the Advanced Practice Practitioners on your physician's team can help you by facilitating care more promptly, often allowing for earlier appointments, access to diagnostic testing, procedures, and other specialty referrals.   Thank you for trusting me with your gastrointestinal care. Deanna May, FNP-C

## 2023-09-25 ENCOUNTER — Telehealth: Payer: Self-pay | Admitting: Gastroenterology

## 2023-09-25 NOTE — Telephone Encounter (Signed)
 Returned patient call & she asked if we could order an ultrasound of her back. She was on depo-provera for 10 years & recently saw a TV ad that stated long term side effects could be harmful to her back. Advised patient to contact her PCP office to discuss further. Pt verbalized all understanding, no further questions.

## 2023-09-25 NOTE — Telephone Encounter (Signed)
 Inbound call from patient stating she forgot to mention some information at yesterday's office visit. States she believes a medication that she was taking may be causing her back pain. Requesting to know if a ultrasound on her back should be completed as well. Please advise, thank you

## 2023-09-29 ENCOUNTER — Inpatient Hospital Stay (HOSPITAL_BASED_OUTPATIENT_CLINIC_OR_DEPARTMENT_OTHER): Admission: RE | Admit: 2023-09-29 | Source: Ambulatory Visit | Admitting: Radiology

## 2023-09-29 ENCOUNTER — Ambulatory Visit (HOSPITAL_BASED_OUTPATIENT_CLINIC_OR_DEPARTMENT_OTHER)
Admission: RE | Admit: 2023-09-29 | Discharge: 2023-09-29 | Disposition: A | Discharge status: Home / Self Care | Source: Ambulatory Visit | Attending: Gastroenterology | Admitting: Gastroenterology

## 2023-09-29 DIAGNOSIS — Z860100 Personal history of colon polyps, unspecified: Secondary | ICD-10-CM

## 2023-09-29 DIAGNOSIS — R1011 Right upper quadrant pain: Secondary | ICD-10-CM

## 2023-09-29 DIAGNOSIS — K760 Fatty (change of) liver, not elsewhere classified: Secondary | ICD-10-CM | POA: Diagnosis not present

## 2023-10-03 ENCOUNTER — Ambulatory Visit: Payer: Self-pay | Admitting: Gastroenterology

## 2023-10-06 NOTE — Telephone Encounter (Signed)
 Inbound call from patient calling in regards to results please advise.

## 2023-10-07 NOTE — Telephone Encounter (Signed)
 Patient calling again regarding results. Please advise, thank you

## 2023-12-03 ENCOUNTER — Encounter: Admitting: Gastroenterology

## 2023-12-29 ENCOUNTER — Telehealth: Payer: Self-pay | Admitting: Gastroenterology

## 2023-12-29 DIAGNOSIS — K219 Gastro-esophageal reflux disease without esophagitis: Secondary | ICD-10-CM

## 2023-12-29 NOTE — Telephone Encounter (Signed)
 Inbound call from patient stating that she needs to make an appointment to be seen with Dr. Charlanne for her GERD. Patient is also requesting to be prescribed something for her GERD due to it affecting her esophagus. Patient was scheduled for January the 5th. Please advise.

## 2023-12-29 NOTE — Telephone Encounter (Signed)
 Spoke with patient. Patient having increased  difficulty swallowing and increased GERD Discussed eating slow, small bites, chewing well, avoid distracted eating. Patient denies NSAID use. Suggested Pepcid q HS Scheduled appointment with Dr Charlanne 11/17 @ 10:20. Patient voiced understanding.

## 2024-01-02 ENCOUNTER — Other Ambulatory Visit: Payer: Self-pay | Admitting: Gastroenterology

## 2024-01-02 ENCOUNTER — Encounter: Payer: Self-pay | Admitting: *Deleted

## 2024-01-02 ENCOUNTER — Other Ambulatory Visit: Payer: Self-pay | Admitting: *Deleted

## 2024-01-02 DIAGNOSIS — R1011 Right upper quadrant pain: Secondary | ICD-10-CM

## 2024-01-02 DIAGNOSIS — K219 Gastro-esophageal reflux disease without esophagitis: Secondary | ICD-10-CM

## 2024-01-02 MED ORDER — PANTOPRAZOLE SODIUM 40 MG PO TBEC
40.0000 mg | DELAYED_RELEASE_TABLET | Freq: Every day | ORAL | 1 refills | Status: DC
Start: 2024-01-02 — End: 2024-01-07

## 2024-01-02 MED ORDER — FAMOTIDINE 20 MG PO TABS: 20.0000 mg | ORAL_TABLET | Freq: Two times a day (BID) | ORAL | 0 refills | Status: DC

## 2024-01-02 NOTE — Telephone Encounter (Signed)
 Inbound call from patient stating she would like a call back from the nurse. She states she is needing medication now. Please advise.

## 2024-01-02 NOTE — Telephone Encounter (Signed)
 Cancelled order or PEPCID. Sent in Rx for Protonix  40 mg every day based on previous Rx. Patient has OV w/ Dr Charlanne 111/9 (11/17 was a typo)

## 2024-01-02 NOTE — Addendum Note (Signed)
 Addended by: KOLEEN PERKINS F on: 01/02/2024 04:32 PM   Modules accepted: Orders

## 2024-01-02 NOTE — Telephone Encounter (Signed)
 Spoke with patient.  Stated she had not tried Pepcid because she can't afford OTC meds.  Asked if it could be sent as Rx and medicaid will cover it. Sent in one time order for Pepcid 20 mg BID. Patient has appointment with Dr Charlanne 11/17 @ 10:20.

## 2024-01-02 NOTE — Addendum Note (Signed)
 Addended by: KOLEEN PERKINS F on: 01/02/2024 04:27 PM   Modules accepted: Orders

## 2024-01-07 ENCOUNTER — Encounter: Payer: Self-pay | Admitting: Gastroenterology

## 2024-01-07 ENCOUNTER — Ambulatory Visit: Admitting: Gastroenterology

## 2024-01-07 VITALS — BP 122/80 | HR 66 | Ht 67.0 in | Wt 196.0 lb

## 2024-01-07 DIAGNOSIS — K219 Gastro-esophageal reflux disease without esophagitis: Secondary | ICD-10-CM | POA: Diagnosis not present

## 2024-01-07 DIAGNOSIS — K582 Mixed irritable bowel syndrome: Secondary | ICD-10-CM | POA: Diagnosis not present

## 2024-01-07 DIAGNOSIS — R1011 Right upper quadrant pain: Secondary | ICD-10-CM

## 2024-01-07 DIAGNOSIS — R1310 Dysphagia, unspecified: Secondary | ICD-10-CM

## 2024-01-07 DIAGNOSIS — Z860100 Personal history of colon polyps, unspecified: Secondary | ICD-10-CM

## 2024-01-07 DIAGNOSIS — K760 Fatty (change of) liver, not elsewhere classified: Secondary | ICD-10-CM

## 2024-01-07 DIAGNOSIS — K449 Diaphragmatic hernia without obstruction or gangrene: Secondary | ICD-10-CM

## 2024-01-07 MED ORDER — NA SULFATE-K SULFATE-MG SULF 17.5-3.13-1.6 GM/177ML PO SOLN: 1.0000 | Freq: Once | ORAL | 0 refills | Status: AC

## 2024-01-07 MED ORDER — PANTOPRAZOLE SODIUM 40 MG PO TBEC: 40.0000 mg | DELAYED_RELEASE_TABLET | Freq: Every day | ORAL | 1 refills | Status: AC

## 2024-01-07 NOTE — Patient Instructions (Addendum)
 _______________________________________________________  If your blood pressure at your visit was 140/90 or greater, please contact your primary care physician to follow up on this.  _______________________________________________________  If you are age 64 or older, your body mass index should be between 23-30. Your Body mass index is 30.7 kg/m. If this is out of the aforementioned range listed, please consider follow up with your Primary Care Provider.  If you are age 41 or younger, your body mass index should be between 19-25. Your Body mass index is 30.7 kg/m. If this is out of the aformentioned range listed, please consider follow up with your Primary Care Provider.   ________________________________________________________  The [facility] providers would like to encourage you to use MYCHART to communicate with providers for non-urgent requests or questions.  Due to long hold times on the telephone, sending your provider a message by [facility] may be a faster and more efficient way to get a response.  Please allow 48 business hours for a response.  Please remember that this is for non-urgent requests.  _______________________________________________________  [facility] is using a team-based approach to care.  Your team is made up of your doctor and two to three APPS. Our APPS (Nurse Practitioners and Physician Assistants) work with your physician to ensure care continuity for you. They are fully qualified to address your health concerns and develop a treatment plan. They communicate directly with your gastroenterologist to care for you. Seeing the Advanced Practice Practitioners on your physician's team can help you by facilitating care more promptly, often allowing for earlier appointments, access to diagnostic testing, procedures, and other specialty referrals.  We have sent the following medications to your pharmacy for you to pick up at your  convenience: Suprep Protonix   Two days before your procedure: Mix 3 packs (or capfuls) of Miralax in 48 ounces of clear liquid and drink at 6pm.  You have been scheduled for an endoscopy and colonoscopy. Please follow the written instructions given to you at your visit today.  If you use inhalers (even only as needed), please bring them with you on the day of your procedure.  DO NOT TAKE 7 DAYS PRIOR TO TEST- Trulicity (dulaglutide) Ozempic, Wegovy (semaglutide) Mounjaro, Zepbound (tirzepatide) Bydureon Bcise (exanatide extended release)  DO NOT TAKE 1 DAY PRIOR TO YOUR TEST Rybelsus (semaglutide) Adlyxin (lixisenatide) Victoza (liraglutide) Byetta (exanatide) ___________________________________________________________________________  Thank you,  Dr. Lynnie Bring

## 2024-01-07 NOTE — Progress Notes (Signed)
 Chief Complaint: Abdominal pain  Referring Provider:  Pc, [facility]*      ASSESSMENT AND PLAN;   #1. GERD with dysphagia with atypical chest pains.  #2. RUQ pain -likely musculoskeletal, could be related to back pain/fibromyalgia.  Has reproducible muscle tenderness (pos Carnett's). Neg CT abdo/pel 02/2019, 2019 neg except for fatty liver, neg US   09/2023, 03/23/2018, 12/2016, neg EGD 10/2015, UGI series, HIDA with EF but had symptoms, s/p cholecystectomy without pain relief.  #3. IBS-C. H/O polyps  #4. Fatty liver with Nl LFTs.  Plan: - Continue protonix  40mg  po qd #30, 6 refills. - EGD with dil/colon with 2 day prep - Encouraged her to lose weight as the only treatment for fatty liver for now.    I discussed EGD/Colonoscopy- the indications, risks, alternatives and potential complications including, but not limited to bleeding, infection, reaction to meds, damage to internal organs, cardiac and/or pulmonary problems, and perforation requiring surgery. The possibility that significant findings could be missed was explained. All ? were answered. Pt consents to proceed.  HPI:    Discussed the use of AI scribe software for clinical note transcription with the patient, who gave verbal consent to proceed.  History of Present Illness Hailey Moyer is a 63 year old female with a history of GERD and fatty liver who presents with worsening swallowing difficulties and abdominal pain.  She experiences worsening dysphagia, particularly with solid foods such as meat, describing a sensation of food getting stuck in her esophagus. She is currently taking pantoprazole  once daily in the morning. She has not needed to manually dislodge food recently, which she had to do in the past.  She describes her abdominal pain as a burning and sore sensation in the epigastric region, initially thought to be related to her GERD The pain is exacerbated by prolonged standing, such as when cooking, and  radiates to her back. The pain is not associated with constipation, as her bowel movements have been regular. An ultrasound performed in August 2025 indicated a fatty liver, but her liver function tests were normal.  Her past medical history includes a ?? hiatal hernia, fatty liver, and a cholecystectomy. She also has a history of migraines for which she takes medication for migraines. She has been prescribed Depakote for headaches, but it worsens her symptoms. She suspects that bruxism contributes to her headaches, as she wakes up with facial pain and headaches daily. She also has a herniated disc in her neck, which she believes may be related to her headaches.  Her current medications include lisinopril and amlodipine for blood pressure, metformin, and pantoprazole . She occasionally uses ibuprofen, but only sparingly, such as when she had the flu a month ago.  No melena or hematochezia.  No recent weight loss.    Previous GI workup:  US  09/2023:  1. Fatty infiltration of the liver. 2. Prior cholecystectomy. 3. Minimal complex right kidney cyst with single thin septation measuring 1.9 x 2.1 x 1.9 cm. This is unchanged compared to prior ultrasound. This is consistent with a Bosniak 2 renal cyst. No follow-up is recommended.  Colon 06/30/2020 - One 10 mm polyp in the proximal sigmoid colon, removed with a hot snare. Resected and retrieved. - Very minimal sigmoid diverticulosis. - Non- bleeding internal hemorrhoids. - Fair prep - Bx-hyperplastic - Rpt 3 yrs with 2 day prep.   EGD 06/30/2020 - Mild gastritis. Biopsied. - Multiple gastric polyps. ( Resected and retrieved x 4) - S/ P empiric esophageal dilatation  52FR - Bx: Negative for EOE, celiac, HP.  Gastric polyps are fundic gland polyps.  CT Abdo/pelvis with p.o. and IV contrast 03/15/2019: Fatty liver, SP cholecystectomy, moderate DJD both hips, small right renal calculus, aortic atherosclerosis.  Report sent for scanning.  CBC  03/15/2019 with hemoglobin 13.8, MCV 91, platelets 334K, CMP with AST 43, ALT 44, normal alk phos 77, albumin 4.4. Past Medical History:  Diagnosis Date   Abnormal mammogram    2 DARK AREAS IN RIGHT BREAST TISSUE ON 08-2019 FOLLOW UP IN 6 MONTHS   Anxiety    Arthritis    Asthma    Chronic neck pain    COVID-19 02/18/2019   SYMPTOMS X 2 WEEKS ACHED ALL OVER, FEVER, ALL SYMPTOMS RESOLVED   Depression    Diabetes (HCC)    Elevated cholesterol    Fatty liver    Fibromyalgia    Gallstone    Ganglion cyst of wrist    RIGHT   GERD (gastroesophageal reflux disease)    Hiatal hernia    History of angina    5 TO 6 YRS AGO, SAW CARDIOLOGY AND RELEASED, NO ANGINA 5 TO 6 YRS   History of kidney stones    LEFT KIDNEY   History of seizure 1992   X 1 WHILE PREGNANT NONE SINCE   HTN (hypertension), benign    Illiterate    Migraine    Other physeal fracture of right metatarsal, initial encounter for closed fracture    WEARS BOOT   UTI (urinary tract infection)     Past Surgical History:  Procedure Laterality Date   APPENDECTOMY  AGE 50   CHOLECYSTECTOMY  2015   COLONOSCOPY  06/01/2014   Mild diverticulosis. Otherwise normal colonsocopy.    COLPOSCOPY  2015   OF CERVIX   CRYOABLATION  1980   CERVIX   Cyst removed from shoulder Left YRS AGO   ESOPHAGOGASTRODUODENOSCOPY  10/25/2015   Mild gastritis. Small hiatal hernia. Gastric polyps status post polypectomy x 2.    HERNIA REPAIR  YRS AGO   [employer]    ORIF TOE FRACTURE Right 10/20/2019   Procedure: OPEN REDUCTION INTERNAL FIXATION (ORIF) METATARSAL (TOE) FRACTURE;  Surgeon: Gretel Ozell PARAS, DPM;  Location: [facility];  Service: Podiatry;  Laterality: Right;  Regional, LMA - Popliteal block   URETHRA SURGERY  1980'S    Family History  Problem Relation Age of Onset   Diabetes Mother    Heart disease Mother    Diabetes Sister    Hypertension Sister    Kidney cancer Father    Colon cancer Neg Hx    Esophageal cancer Neg Hx    Stomach  cancer Neg Hx    Rectal cancer Neg Hx     Social History   Tobacco Use   Smoking status: Never   Smokeless tobacco: Never  Vaping Use   Vaping status: Never Used  Substance Use Topics   Alcohol use: Not Currently   Drug use: Never    Current Outpatient Medications  Medication Sig Dispense Refill   amLODipine (NORVASC) 10 MG tablet Take 10 mg by mouth daily.     ammonium lactate (LAC-HYDRIN) 12 % lotion Apply topically 2 (two) times daily as needed.     Aspirin-Acetaminophen -Caffeine (EXCEDRIN MIGRAINE PO) Take 1 tablet by mouth daily as needed (Migraines).      atorvastatin (LIPITOR) 10 MG tablet Take 1 tablet by mouth at bedtime.     clobetasol (TEMOVATE) 0.05 % external solution Apply 1 Application  topically.     cyclobenzaprine (FLEXERIL) 10 MG tablet Take 10 mg by mouth 2 (two) times daily as needed.     dicyclomine  (BENTYL ) 20 MG tablet TAKE 1 TABLET(20 MG) BY MOUTH TWICE DAILY AS NEEDED FOR SPASMS 60 tablet 1   divalproex (DEPAKOTE) 500 MG DR tablet Take 1,000 mg by mouth.     hydrocortisone  (ANUSOL -HC) 2.5 % rectal cream Place 1 application rectally as directed. 1 application 2 times daily for 7 days 30 g 1   ibuprofen (ADVIL) 800 MG tablet Take by mouth at bedtime.     lisinopril (PRINIVIL,ZESTRIL) 10 MG tablet Take 10 mg by mouth daily.      metFORMIN (GLUCOPHAGE-XR) 500 MG 24 hr tablet TAKE 1 TABLET(500 MG) BY MOUTH EVERY NIGHT     ondansetron  (ZOFRAN ) 4 MG tablet Take 1 tablet (4 mg total) by mouth every 8 (eight) hours as needed for nausea or vomiting. 20 tablet 0   pantoprazole  (PROTONIX ) 40 MG tablet Take 1 tablet (40 mg total) by mouth daily. Call [phone] to schedule an office visit for more refills. 30 tablet 1   PARoxetine (PAXIL) 10 MG tablet Take 10 mg by mouth daily.     PROAIR HFA 108 (90 Base) MCG/ACT inhaler Inhale 2 puffs into the lungs daily as needed for wheezing or shortness of breath.      promethazine (PHENERGAN) 25 MG tablet Take 25 mg by mouth  daily as needed (Migraines).      gabapentin (NEURONTIN) 300 MG capsule Take 300 mg by mouth at bedtime. (Patient not taking: Reported on 01/07/2024)     No current facility-administered medications for this visit.    Allergies  Allergen Reactions   Sulfa Antibiotics Rash    TONGUE TURNED WHITE    Review of Systems:  Has anxiety or depression     Physical Exam:    BP 122/80   Pulse 66   Ht 5' 7 (1.702 m)   Wt 196 lb (88.9 kg)   LMP 02/19/1999   BMI 30.70 kg/m  Filed Weights   01/07/24 1341  Weight: 196 lb (88.9 kg)   Constitutional:  Well-developed, in no acute distress. Psychiatric: Normal mood and affect. Behavior is normal. HEENT: Pupils normal.  Conjunctivae are normal. No scleral icterus. Cardiovascular: Normal rate, regular rhythm. No edema Pulmonary/chest: Effort normal and breath sounds normal. No wheezing, rales or rhonchi. Abdominal: Soft, nondistended.  Right upper quad abdominal tenderness-reproducible.  Bowel sounds active throughout. There are no masses palpable. No hepatomegaly. Rectal:  defered Neurological: Alert and oriented to person place and time. Skin: Skin is warm and dry. No rashes noted.  Data Reviewed: I have personally reviewed following labs and imaging studies  CBC:    Latest Ref Rng & Units 05/26/2020   11:27 AM 10/20/2019   12:00 PM 04/23/2018   11:10 AM  CBC  WBC 4.0 - 10.5 K/uL 8.1  8.3  6.9   Hemoglobin 12.0 - 15.0 g/dL 86.1  86.1  86.2   Hematocrit 36.0 - 46.0 % 41.7  42.2  41.4   Platelets 150.0 - 400.0 K/uL 340.0  335  326.0      Anselm Bring, MD 01/07/2024, 2:17 PM  Cc: [facility]*

## 2024-01-07 NOTE — Telephone Encounter (Signed)
 error

## 2024-02-18 ENCOUNTER — Other Ambulatory Visit: Payer: Self-pay | Admitting: Gastroenterology

## 2024-02-18 DIAGNOSIS — K219 Gastro-esophageal reflux disease without esophagitis: Secondary | ICD-10-CM

## 2024-02-23 ENCOUNTER — Ambulatory Visit: Admitting: Gastroenterology

## 2024-02-24 ENCOUNTER — Encounter: Admitting: Gastroenterology

## 2024-02-27 ENCOUNTER — Other Ambulatory Visit: Payer: Self-pay | Admitting: Gastroenterology

## 2024-02-27 DIAGNOSIS — K219 Gastro-esophageal reflux disease without esophagitis: Secondary | ICD-10-CM
